# Patient Record
Sex: Female | Born: 1985 | Race: White | Hispanic: No | Marital: Single | Smoking: Current every day smoker
Health system: Southern US, Community
[De-identification: ages and names within clinical notes are randomized; demographics above are authoritative.]

## PROBLEM LIST (undated history)

## (undated) DIAGNOSIS — A6000 Herpesviral infection of urogenital system, unspecified: Secondary | ICD-10-CM

## (undated) HISTORY — PX: ORTHOPEDIC SURGERY: SHX850

## (undated) SURGERY — Surgical Case
Anesthesia: *Unknown

---
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
## 2006-01-28 ENCOUNTER — Observation Stay: Payer: Self-pay

## 2006-01-29 ENCOUNTER — Ambulatory Visit: Payer: Self-pay | Admitting: Obstetrics & Gynecology

## 2006-02-01 ENCOUNTER — Observation Stay: Payer: Self-pay | Admitting: Obstetrics & Gynecology

## 2006-03-06 ENCOUNTER — Observation Stay: Payer: Self-pay

## 2006-03-21 ENCOUNTER — Inpatient Hospital Stay: Payer: Self-pay | Admitting: Obstetrics & Gynecology

## 2006-08-18 ENCOUNTER — Emergency Department: Payer: Self-pay | Admitting: Emergency Medicine

## 2007-02-27 ENCOUNTER — Inpatient Hospital Stay: Payer: Self-pay | Admitting: Obstetrics & Gynecology

## 2010-05-28 ENCOUNTER — Ambulatory Visit: Payer: Self-pay | Admitting: Family Medicine

## 2010-05-29 ENCOUNTER — Inpatient Hospital Stay: Payer: Self-pay | Admitting: Surgery

## 2012-05-05 ENCOUNTER — Ambulatory Visit: Payer: Self-pay | Admitting: Emergency Medicine

## 2012-05-08 DIAGNOSIS — L732 Hidradenitis suppurativa: Secondary | ICD-10-CM | POA: Insufficient documentation

## 2013-06-04 ENCOUNTER — Emergency Department: Payer: Self-pay | Admitting: Internal Medicine

## 2013-06-04 LAB — RAPID INFLUENZA A&B ANTIGENS (ARMC ONLY)

## 2013-06-19 ENCOUNTER — Emergency Department: Payer: Self-pay | Admitting: Emergency Medicine

## 2013-06-19 LAB — ETHANOL
ETHANOL %: 0.066 % (ref 0.000–0.080)
ETHANOL LVL: 66 mg/dL

## 2013-06-19 LAB — CBC WITH DIFFERENTIAL/PLATELET
BASOS PCT: 0.6 %
Basophil #: 0.1 10*3/uL (ref 0.0–0.1)
Eosinophil #: 0 10*3/uL (ref 0.0–0.7)
Eosinophil %: 0.3 %
HCT: 40.9 % (ref 35.0–47.0)
HGB: 14.3 g/dL (ref 12.0–16.0)
LYMPHS ABS: 1.6 10*3/uL (ref 1.0–3.6)
LYMPHS PCT: 14.5 %
MCH: 32.6 pg (ref 26.0–34.0)
MCHC: 34.9 g/dL (ref 32.0–36.0)
MCV: 93 fL (ref 80–100)
Monocyte #: 0.4 x10 3/mm (ref 0.2–0.9)
Monocyte %: 4.1 %
NEUTROS PCT: 80.5 %
Neutrophil #: 8.8 10*3/uL — ABNORMAL HIGH (ref 1.4–6.5)
Platelet: 170 10*3/uL (ref 150–440)
RBC: 4.38 10*6/uL (ref 3.80–5.20)
RDW: 13.3 % (ref 11.5–14.5)
WBC: 11 10*3/uL (ref 3.6–11.0)

## 2013-06-19 LAB — PROTIME-INR
INR: 1
Prothrombin Time: 12.9 secs (ref 11.5–14.7)

## 2013-06-19 LAB — COMPREHENSIVE METABOLIC PANEL
ALK PHOS: 61 U/L
ANION GAP: 6 — AB (ref 7–16)
AST: 23 U/L (ref 15–37)
Albumin: 3.6 g/dL (ref 3.4–5.0)
BUN: 8 mg/dL (ref 7–18)
Bilirubin,Total: 0.2 mg/dL (ref 0.2–1.0)
CALCIUM: 8.7 mg/dL (ref 8.5–10.1)
CO2: 23 mmol/L (ref 21–32)
CREATININE: 0.77 mg/dL (ref 0.60–1.30)
Chloride: 109 mmol/L — ABNORMAL HIGH (ref 98–107)
EGFR (African American): >60
Glucose: 145 mg/dL — ABNORMAL HIGH (ref 65–99)
Osmolality: 277 (ref 275–301)
Potassium: 3.6 mmol/L (ref 3.5–5.1)
SGPT (ALT): 25 U/L (ref 12–78)
Sodium: 138 mmol/L (ref 136–145)
Total Protein: 7.7 g/dL (ref 6.4–8.2)

## 2013-06-23 ENCOUNTER — Ambulatory Visit: Payer: Self-pay | Admitting: General Practice

## 2013-11-19 ENCOUNTER — Emergency Department: Payer: Self-pay | Admitting: Emergency Medicine

## 2013-11-19 LAB — CBC WITH DIFFERENTIAL/PLATELET
Basophil #: 0.1 10*3/uL (ref 0.0–0.1)
Basophil %: 1.3 %
EOS ABS: 0.1 10*3/uL (ref 0.0–0.7)
Eosinophil %: 1.2 %
HCT: 40.5 % (ref 35.0–47.0)
HGB: 13.7 g/dL (ref 12.0–16.0)
Lymphocyte #: 2.6 10*3/uL (ref 1.0–3.6)
Lymphocyte %: 25.6 %
MCH: 31.7 pg (ref 26.0–34.0)
MCHC: 33.7 g/dL (ref 32.0–36.0)
MCV: 94 fL (ref 80–100)
Monocyte #: 0.5 x10 3/mm (ref 0.2–0.9)
Monocyte %: 5.3 %
Neutrophil #: 6.7 10*3/uL — ABNORMAL HIGH (ref 1.4–6.5)
Neutrophil %: 66.6 %
Platelet: 166 10*3/uL (ref 150–440)
RBC: 4.3 10*6/uL (ref 3.80–5.20)
RDW: 12.9 % (ref 11.5–14.5)
WBC: 10.1 10*3/uL (ref 3.6–11.0)

## 2013-11-19 LAB — URINALYSIS, COMPLETE
BACTERIA: NONE SEEN
BILIRUBIN, UR: NEGATIVE
Blood: NEGATIVE
Glucose,UR: NEGATIVE mg/dL (ref 0–75)
KETONE: NEGATIVE
LEUKOCYTE ESTERASE: NEGATIVE
Nitrite: NEGATIVE
Ph: 6 (ref 4.5–8.0)
Protein: NEGATIVE
RBC,UR: <1 /HPF (ref 0–5)
Specific Gravity: 1.011 (ref 1.003–1.030)
Squamous Epithelial: 1

## 2013-11-19 LAB — COMPREHENSIVE METABOLIC PANEL
ALBUMIN: 3.3 g/dL — AB (ref 3.4–5.0)
AST: 20 U/L (ref 15–37)
Alkaline Phosphatase: 65 U/L
Anion Gap: 7 (ref 7–16)
BUN: 6 mg/dL — ABNORMAL LOW (ref 7–18)
Bilirubin,Total: 0.2 mg/dL (ref 0.2–1.0)
CHLORIDE: 107 mmol/L (ref 98–107)
Calcium, Total: 8.8 mg/dL (ref 8.5–10.1)
Co2: 23 mmol/L (ref 21–32)
Creatinine: 1.04 mg/dL (ref 0.60–1.30)
EGFR (African American): >60
Glucose: 114 mg/dL — ABNORMAL HIGH (ref 65–99)
Osmolality: 272 (ref 275–301)
POTASSIUM: 3.4 mmol/L — AB (ref 3.5–5.1)
SGPT (ALT): 24 U/L (ref 12–78)
Sodium: 137 mmol/L (ref 136–145)
Total Protein: 7.3 g/dL (ref 6.4–8.2)

## 2013-11-19 LAB — LIPASE, BLOOD: Lipase: 138 U/L (ref 73–393)

## 2013-11-19 LAB — HCG, QUANTITATIVE, PREGNANCY: Beta Hcg, Quant.: 4374 m[IU]/mL — ABNORMAL HIGH

## 2013-12-31 ENCOUNTER — Ambulatory Visit: Payer: Self-pay | Admitting: Family Medicine

## 2014-01-05 ENCOUNTER — Emergency Department: Payer: Self-pay | Admitting: Emergency Medicine

## 2014-01-05 LAB — CBC
HCT: 36.7 % (ref 35.0–47.0)
HGB: 12.3 g/dL (ref 12.0–16.0)
MCH: 31 pg (ref 26.0–34.0)
MCHC: 33.4 g/dL (ref 32.0–36.0)
MCV: 93 fL (ref 80–100)
PLATELETS: 190 10*3/uL (ref 150–440)
RBC: 3.95 10*6/uL (ref 3.80–5.20)
RDW: 12.7 % (ref 11.5–14.5)
WBC: 12.8 10*3/uL — ABNORMAL HIGH (ref 3.6–11.0)

## 2014-01-05 LAB — URINALYSIS, COMPLETE
BACTERIA: NONE SEEN
BILIRUBIN, UR: NEGATIVE
GLUCOSE, UR: NEGATIVE mg/dL (ref 0–75)
Ketone: NEGATIVE
Leukocyte Esterase: NEGATIVE
NITRITE: NEGATIVE
PROTEIN: NEGATIVE
Ph: 5 (ref 4.5–8.0)
RBC,UR: 3 /HPF (ref 0–5)
SPECIFIC GRAVITY: 1.019 (ref 1.003–1.030)
WBC UR: 3 /HPF (ref 0–5)

## 2014-01-05 LAB — COMPREHENSIVE METABOLIC PANEL
ALT: 14 U/L
Albumin: 3 g/dL — ABNORMAL LOW (ref 3.4–5.0)
Alkaline Phosphatase: 62 U/L
Anion Gap: 9 (ref 7–16)
BILIRUBIN TOTAL: 0.3 mg/dL (ref 0.2–1.0)
BUN: 7 mg/dL (ref 7–18)
CO2: 21 mmol/L (ref 21–32)
Calcium, Total: 9.3 mg/dL (ref 8.5–10.1)
Chloride: 109 mmol/L — ABNORMAL HIGH (ref 98–107)
Creatinine: 0.71 mg/dL (ref 0.60–1.30)
EGFR (African American): >60
GLUCOSE: 75 mg/dL (ref 65–99)
Osmolality: 274 (ref 275–301)
Potassium: 3.8 mmol/L (ref 3.5–5.1)
SGOT(AST): 18 U/L (ref 15–37)
SODIUM: 139 mmol/L (ref 136–145)
Total Protein: 7.4 g/dL (ref 6.4–8.2)

## 2014-01-05 LAB — LIPASE, BLOOD: Lipase: 81 U/L (ref 73–393)

## 2014-01-05 LAB — HCG, QUANTITATIVE, PREGNANCY: BETA HCG, QUANT.: 43965 m[IU]/mL — AB

## 2014-04-22 ENCOUNTER — Encounter: Payer: Self-pay | Admitting: Obstetrics and Gynecology

## 2014-05-27 ENCOUNTER — Encounter: Payer: Self-pay | Admitting: Obstetrics & Gynecology

## 2014-06-15 ENCOUNTER — Observation Stay: Payer: Self-pay | Admitting: Obstetrics and Gynecology

## 2014-07-01 ENCOUNTER — Encounter: Payer: Self-pay | Admitting: Maternal & Fetal Medicine

## 2014-07-03 ENCOUNTER — Observation Stay: Payer: Self-pay | Admitting: Obstetrics and Gynecology

## 2014-07-05 ENCOUNTER — Inpatient Hospital Stay: Payer: Self-pay

## 2014-08-22 IMAGING — CR DG HUMERUS 2V *L*
1 series · 3 of 3 positions shown · non-contrast
Comparison: DG ELBOW COMPLETE 3+V*L* dated 06/19/2013

CLINICAL DATA: Distal humerus fracture

EXAM:
LEFT HUMERUS - 2+ VIEW

[Series 1: ap · 0.17mm/px · 3 of 3 slices shown]
[im 1/3]
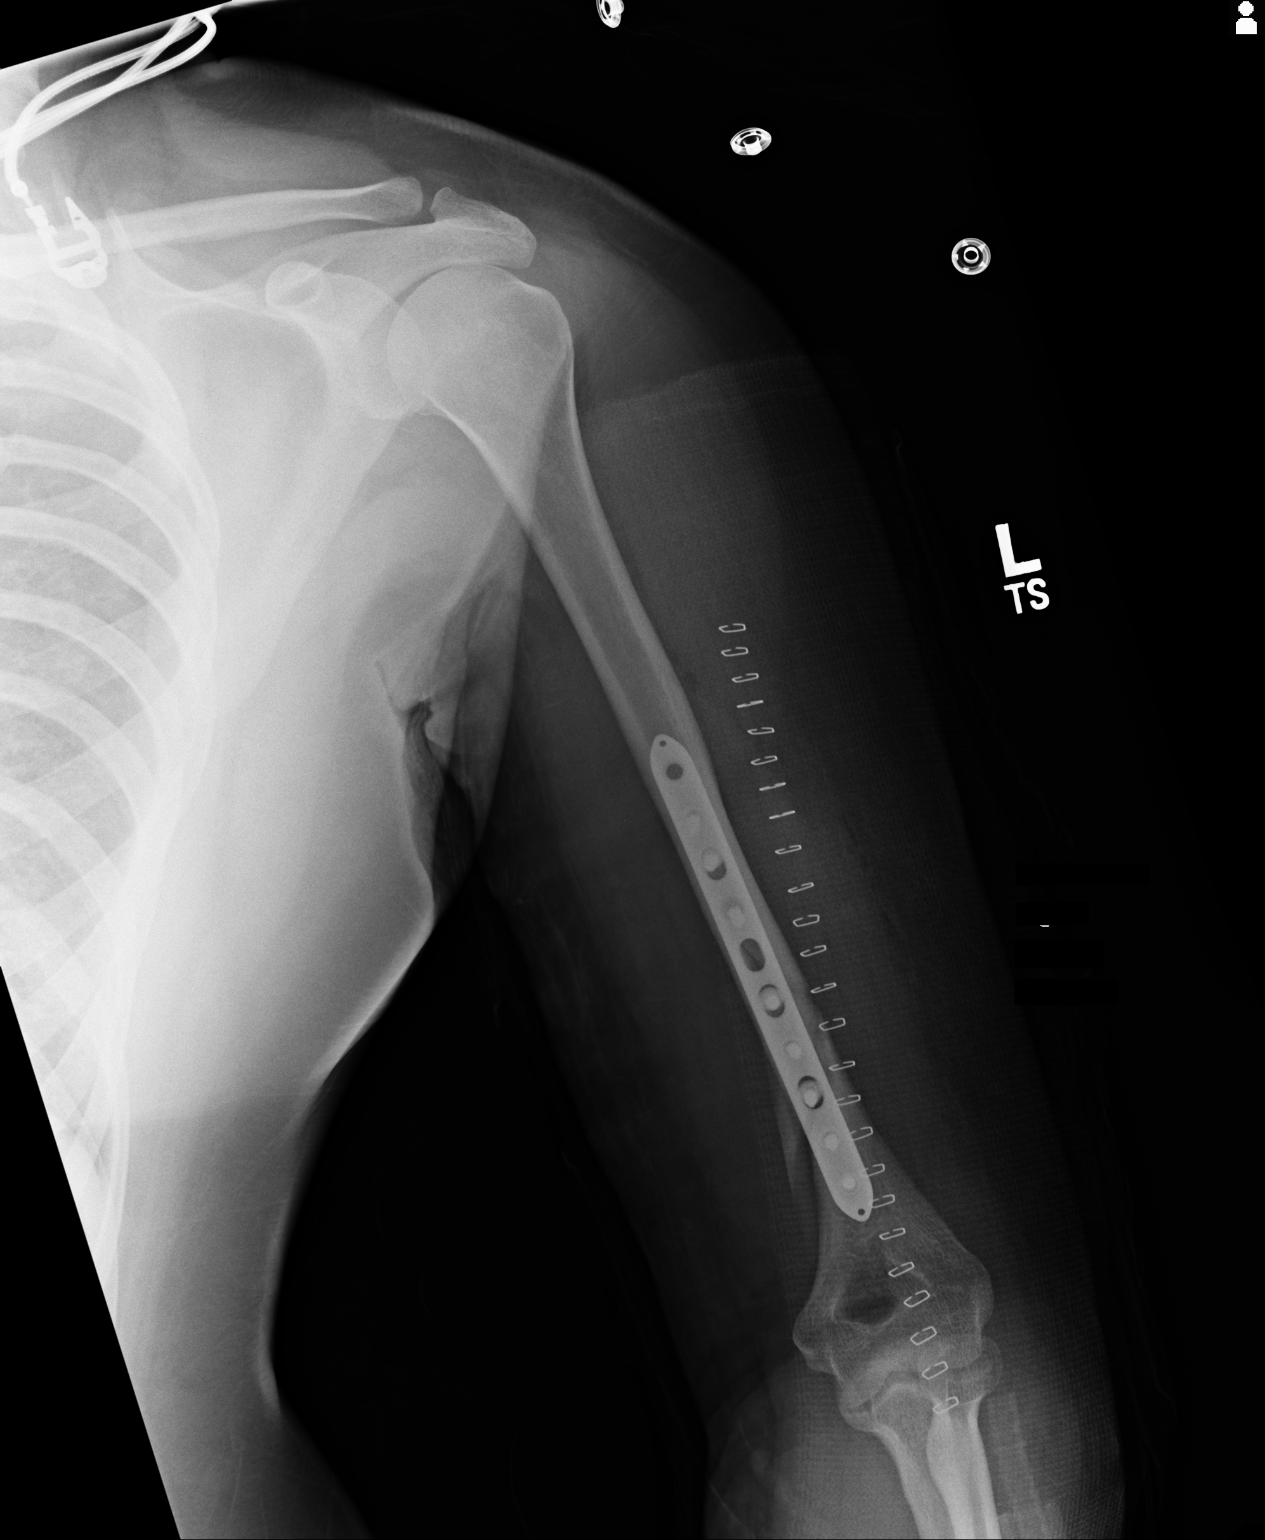
[im 2/3]
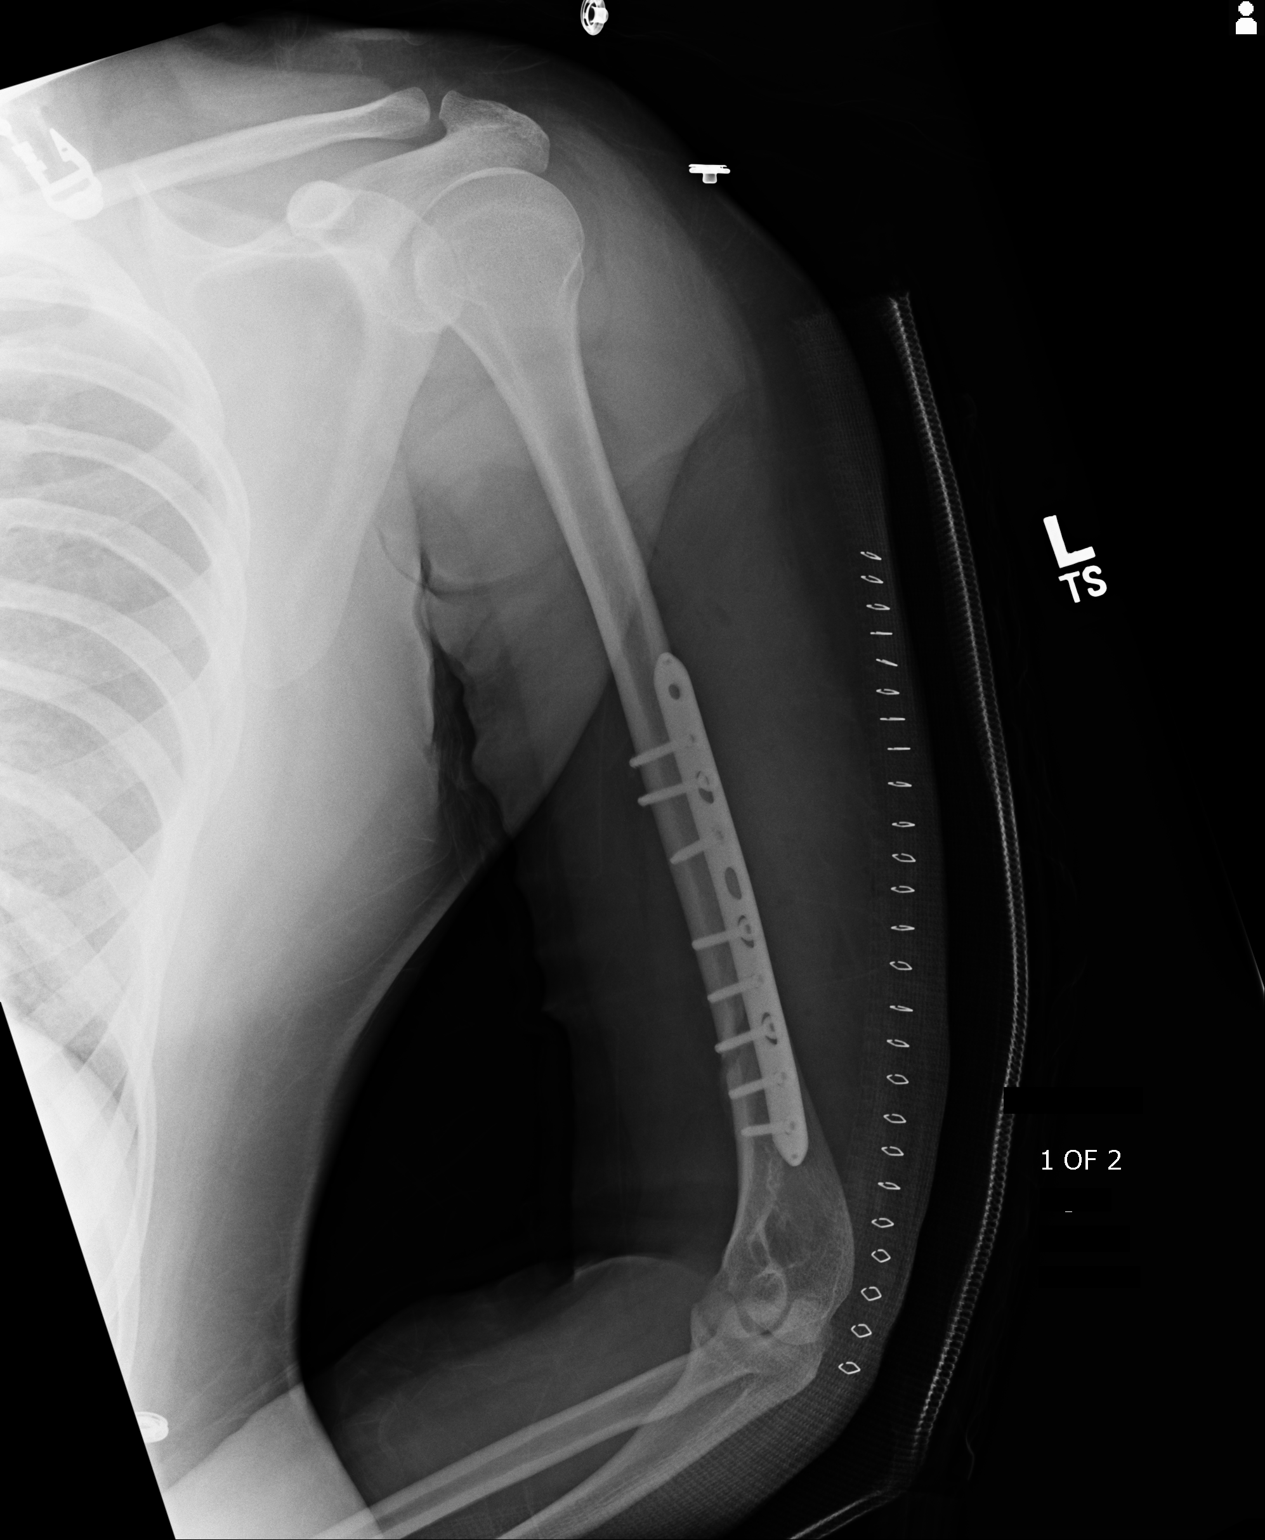
[im 3/3]
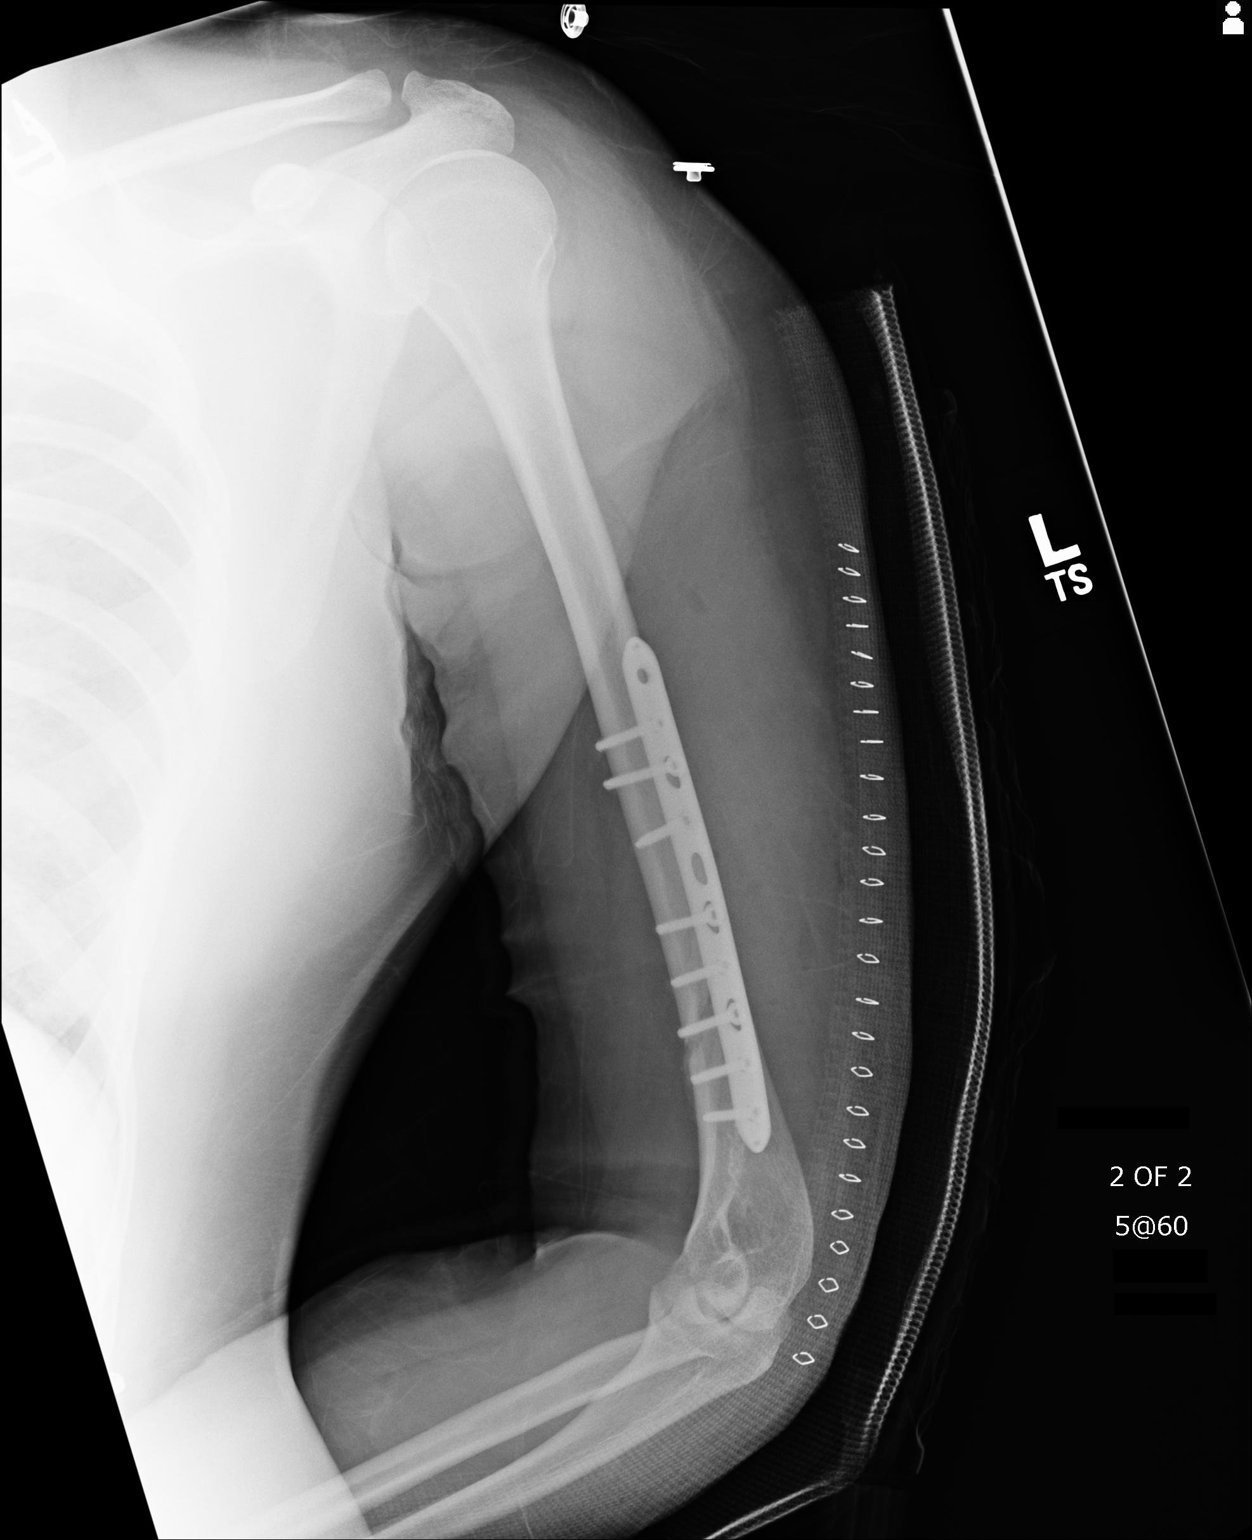

[3 of 3 positions shown; findings below may reference images not displayed]

FINDINGS: There is internal plate fixation of the distal humerus fracture with
dynamic fixation plate and 8 cortical screws. Marked improvement in
alignment. No complicating features.
IMPRESSION: No complication following open reduction internal fixation of distal
left humerus fracture.

## 2014-08-28 NOTE — Op Note (Signed)
PATIENT NAME:  Jasmine Hale, Jasmine Hale MR#:  [id number] DATE OF BIRTH:  1986-01-11  DATE OF PROCEDURE:  06/22/2013  PREOPERATIVE DIAGNOSIS: Comminuted left distal humeral shaft fracture.   POSTOPERATIVE DIAGNOSIS: Comminuted left distal humeral shaft fracture.   PROCEDURE PERFORMED: Open reduction internal fixation of left distal humeral shaft fracture.   SURGEON: Francesco SorJames Hooten, MD.   ANESTHESIA: General.   ESTIMATED BLOOD LOSS: 75 mL.   FLUIDS REPLACED: 1500 mL of crystalloid.   TOURNIQUET TIME: 9 minutes.   DRAINS: None.   IMPLANTS UTILIZED: DePuy/Biomet 10-hole 3.5 mm locking plate, seven 3.5 mm cortical screws and one 3.5 mm locking screw.   INDICATIONS FOR SURGERY: The patient is a 29 year old right-hand dominant female, who fell last week and landed on her left arm. X-rays demonstrated a comminuted fracture of the distal shaft of the humerus. After discussion of the risks and benefits of surgical intervention, the patient expressed understanding of the risks and benefits, and agreed with plans for surgical intervention.   PROCEDURE IN DETAIL: The patient was brought into the operating room then, after adequate general anesthesia was achieved, the patient was positioned in a right lateral decubitus position on beanbag and axillary roll was placed. The patient's left arm and forearm were cleaned and prepped with alcohol and DuraPrep and draped in the usual sterile fashion. A "timeout" was performed as per usual protocol. A sterile tourniquet was initially applied and the left upper extremity was exsanguinated using an Esmarch and the tourniquet was inflated to 250 mmHg. A posterior longitudinal incision was made. The site where the lateral and long heads of the triceps was defined and a triceps splitting approach was utilized. The radial nerve was identified and carefully monitored throughout the procedure. The medial head of the triceps was also split in line, again taking care to protect the  radial nerve. The fracture site was thus identified with fragmentation noted. Provisional reduction was performed. Due to the relatively small size of the patient's humerus as well as the fragmentation, it was elected not to use a 4.5 mm plate, but to attempt to stabilize with a 3.5 mm locking plate. A 10-hole 3.5 mm locking plate was identified as being appropriate size to span the fracture. The plate was extended under the radial nerve directly on the posterior aspect of the humerus. Again, care was taken to gently retract the radial nerve while placement of the plate.   Position was evaluated in multiple planes using the FluoroScan. Given the long oblique fragments, two 3.5 mm cortical screws were used in a lag type fashion through the plate. Additional fixation of the plate was performed with an additional five 3.5 mm cortical screws and one 3.5 mm locking screw. Again, reduction was confirmed in multiple planes using FluoroScan with good position of the hardware and good reduction noted. The radial nerve was again assessed and it was safely noted to pass over the proximal most portion of the plate with that hole left open as a potential marker. The wound was irrigated with copious amounts of normal saline with antibiotic solution. It should be noted, that tourniquet had been deflated after identification of the radial nerve after initial tourniquet time of only 9 minutes. Good hemostasis was noted. The fascia was closed using interrupted sutures of #1 Vicryl. The subcutaneous tissue was approximated in layers using first #0 Vicryl followed by 2-0 Vicryl. The skin was closed with skin staples. Sterile dressing was applied followed by application of a posterior splint.   The  patient tolerated the procedure well. She was transported to the recovery room in stable condition. Initial assessment of radial nerve function was noted to be intact in the recovery room.  ____________________________ Illene Labrador. Angie Fava., MD jph:aw D: 06/23/2013 07:32:00 ET T: 06/23/2013 07:49:31 ET JOB#: [id number]  cc: Fayrene Fearing P. Angie Fava., MD, <Dictator> Illene Labrador Angie Fava MD ELECTRONICALLY SIGNED 06/26/2013 6:43

## 2014-09-14 NOTE — H&P (Signed)
L&D Evaluation:  History Expanded:  HPI Pt is a 29 yo [id number] at 6748w2d with an EDC of 07/17/14 per early US, presents to L&D with reports of SROM around 11 am today. She reports +FM, denies vb or regular contractions. 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 She was scheduled for IOL at 39 wks. She is O+, VI, RI, GBS negative. TDAP UTD.   Maternal Varicella Immune   Rubella Results (Maternal) immune   Presents with contractions   Patient's Medical History HSV, migraines   Patient's Surgical History other  arm surgery   Medications Pre Natal Vitamins  Valtrex   Allergies Sulfa   Social History tobacco  1/2 - 3/4 PPD   Family History Non-Contributory   ROS:  ROS All systems were reviewed.  HEENT, CNS, GI, GU, Respiratory, CV, Renal and Musculoskeletal systems were found to be normal.   Exam:  Vital Signs stable   General no apparent distress   Mental Status clear   Chest clear   Heart normal sinus rhythm   Abdomen gravid, non-tender   Estimated Fetal Weight Small for gestational age, 825-12 per US on 2/25   Pelvic 4 cm per RN   Mebranes Ruptured   Description clear   FHT normal rate with no decels, 140, moderate variability, +accels, no decels   Ucx irregular   Skin dry, no lesions, no rashes   Lymph no lymphadenopathy   Impression:  Impression reactive NST, IUP at 3548w2d, SROM   Plan:  Comments Pt ambulated for approx 1 hour with no increase in contractions. Discussed Pitocin augmentation now vs ambulating for another hour or two. Pt opts to proceed with Pitocin now. Anticipate vaginal delivery.  Epidural when appropriate.   Electronic Signatures: Jasmine Hale, Jasmine Hale (CNM)  (Signed 29-Feb-16 14:00)  Authored: L&D Evaluation   Last Updated: 29-Feb-16 14:00 by Jasmine Hale, Kambrea Carrasco Hale  (CNM)

## 2014-09-14 NOTE — H&P (Signed)
L&D Evaluation:  History:  HPI Pt is a 29 yo [id number] at 35.[redacted] weeks GA with an EDC of 07/17/14 presents to L&D with reports of contractions that have gotten worse since this late last night. She reports +FM, denies vb, or lof. Her prenatal course is significant for HSV, prior history of PT at 33 weeks, she is currently on 17-P, and SGA with growth scan showing EFW 11% with repeated growths scans ordered. She is O+, VI, RI.   Presents with contractions   Patient's Medical History HSV, migraines   Patient's Surgical History other  arm surgery   Medications Pre Natal Vitamins   Allergies Sulfa   Social History tobacco   Family History Non-Contributory   ROS:  ROS All systems were reviewed.  HEENT, CNS, GI, GU, Respiratory, CV, Renal and Musculoskeletal systems were found to be normal.   Exam:  Vital Signs stable   General no apparent distress   Mental Status clear   Chest clear   Heart normal sinus rhythm   Abdomen gravid, tender with contractions   Pelvic 3/75-80/-1   Mebranes Intact   FHT normal rate with no decels, 135, moderate variability, +accels, no decels   Ucx regular, Q2   Skin dry, no lesions, no rashes   Lymph no lymphadenopathy   Impression:  Impression reactive NST, IUP at 35.3, R/O labor   Plan:  Plan UA, EFM/NST, monitor contractions and for cervical change   Follow Up Appointment already scheduled   Electronic Signatures: Jannet MantisSubudhi, Kenyatta Gloeckner (CNM)  (Signed 09-Feb-16 11:36)  Authored: L&D Evaluation   Last Updated: 09-Feb-16 11:36 by Jannet MantisSubudhi, Siraj Dermody (CNM)

## 2014-12-09 DIAGNOSIS — S42111A Displaced fracture of body of scapula, right shoulder, initial encounter for closed fracture: Secondary | ICD-10-CM | POA: Insufficient documentation

## 2014-12-09 DIAGNOSIS — S62336A Displaced fracture of neck of fifth metacarpal bone, right hand, initial encounter for closed fracture: Secondary | ICD-10-CM | POA: Insufficient documentation

## 2014-12-10 DIAGNOSIS — S2231XA Fracture of one rib, right side, initial encounter for closed fracture: Secondary | ICD-10-CM | POA: Insufficient documentation

## 2014-12-10 DIAGNOSIS — S36039A Unspecified laceration of spleen, initial encounter: Secondary | ICD-10-CM | POA: Insufficient documentation

## 2014-12-10 DIAGNOSIS — J982 Interstitial emphysema: Secondary | ICD-10-CM | POA: Insufficient documentation

## 2014-12-10 DIAGNOSIS — S32810A Multiple fractures of pelvis with stable disruption of pelvic ring, initial encounter for closed fracture: Secondary | ICD-10-CM | POA: Insufficient documentation

## 2014-12-10 DIAGNOSIS — J939 Pneumothorax, unspecified: Secondary | ICD-10-CM | POA: Insufficient documentation

## 2014-12-10 DIAGNOSIS — S42302A Unspecified fracture of shaft of humerus, left arm, initial encounter for closed fracture: Secondary | ICD-10-CM | POA: Insufficient documentation

## 2014-12-10 DIAGNOSIS — S27321A Contusion of lung, unilateral, initial encounter: Secondary | ICD-10-CM | POA: Insufficient documentation

## 2014-12-21 DIAGNOSIS — S2232XA Fracture of one rib, left side, initial encounter for closed fracture: Secondary | ICD-10-CM | POA: Insufficient documentation

## 2014-12-21 DIAGNOSIS — S42144A Nondisplaced fracture of glenoid cavity of scapula, right shoulder, initial encounter for closed fracture: Secondary | ICD-10-CM | POA: Insufficient documentation

## 2014-12-21 DIAGNOSIS — S62309A Unspecified fracture of unspecified metacarpal bone, initial encounter for closed fracture: Secondary | ICD-10-CM | POA: Insufficient documentation

## 2014-12-21 DIAGNOSIS — S42209A Unspecified fracture of upper end of unspecified humerus, initial encounter for closed fracture: Secondary | ICD-10-CM | POA: Insufficient documentation

## 2015-04-26 ENCOUNTER — Ambulatory Visit: Payer: Medicaid Other | Admitting: Physical Therapy

## 2015-05-08 NOTE — L&D Delivery Note (Signed)
Delivery Note At 5:24 AM a viable female was delivered via Vaginal, Spontaneous Delivery (Presentation: Right Occiput Anterior).  APGAR: 8, 9; weight 6 lb 8.1 oz (2950 g).   Placenta status: Intact, Spontaneous.  Cord: 3 vessels with the following complications: True Knot.  Cord pH: n/a  Anesthesia: Spinal  Episiotomy: None Lacerations: 1st degree Suture Repair: not repaired Est. Blood Loss (mL):  200cc  Mom to postpartum.  Baby to Couplet care / Skin to Skin.  Mom presented with PROM and was augmented with pitocin.  Epidural not able to be placed successfully, spinal placed.  Patient progressed to complete and pushed with 2 contractions.  Viable female delivered without difficulty, nuchal x1 reduced at perineum, baby placed on mom's chest.  After brief delay, cord was doubly clamped and cut by FOB.  True knot x1, loose.  Placenta delivered spontaneously and intact.  Shallow 1st degree, hemostatic.  We sang Happy [place] OuchBirthday to baby Samuel GermanyGage.  Mom has requested PPTL and is scheduled for this afternoon.  Zoie Sarin C Vasily Fedewa 10/14/2015, 8:43 AM

## 2015-05-11 ENCOUNTER — Ambulatory Visit: Payer: Medicaid Other | Attending: Obstetrics & Gynecology | Admitting: Physical Therapy

## 2015-10-13 ENCOUNTER — Inpatient Hospital Stay
Admission: EM | Admit: 2015-10-13 | Discharge: 2015-10-16 | DRG: 767 | Disposition: A | Discharge status: Home / Self Care | Payer: Medicaid Other | Attending: Obstetrics & Gynecology | Admitting: Obstetrics & Gynecology

## 2015-10-13 DIAGNOSIS — G8929 Other chronic pain: Secondary | ICD-10-CM | POA: Diagnosis present

## 2015-10-13 DIAGNOSIS — A6000 Herpesviral infection of urogenital system, unspecified: Secondary | ICD-10-CM | POA: Diagnosis present

## 2015-10-13 DIAGNOSIS — O98513 Other viral diseases complicating pregnancy, third trimester: Secondary | ICD-10-CM | POA: Diagnosis present

## 2015-10-13 DIAGNOSIS — F17200 Nicotine dependence, unspecified, uncomplicated: Secondary | ICD-10-CM | POA: Diagnosis present

## 2015-10-13 DIAGNOSIS — O0993 Supervision of high risk pregnancy, unspecified, third trimester: Secondary | ICD-10-CM

## 2015-10-13 DIAGNOSIS — O24415 Gestational diabetes mellitus in pregnancy, controlled by oral hypoglycemic drugs: Secondary | ICD-10-CM | POA: Diagnosis present

## 2015-10-13 DIAGNOSIS — Z833 Family history of diabetes mellitus: Secondary | ICD-10-CM

## 2015-10-13 DIAGNOSIS — O99334 Smoking (tobacco) complicating childbirth: Secondary | ICD-10-CM | POA: Diagnosis present

## 2015-10-13 DIAGNOSIS — Z882 Allergy status to sulfonamides status: Secondary | ICD-10-CM

## 2015-10-13 DIAGNOSIS — Z302 Encounter for sterilization: Secondary | ICD-10-CM

## 2015-10-13 DIAGNOSIS — F17210 Nicotine dependence, cigarettes, uncomplicated: Secondary | ICD-10-CM | POA: Diagnosis present

## 2015-10-13 DIAGNOSIS — O99210 Obesity complicating pregnancy, unspecified trimester: Secondary | ICD-10-CM | POA: Diagnosis present

## 2015-10-13 DIAGNOSIS — G8921 Chronic pain due to trauma: Secondary | ICD-10-CM | POA: Diagnosis present

## 2015-10-13 DIAGNOSIS — O2340 Unspecified infection of urinary tract in pregnancy, unspecified trimester: Secondary | ICD-10-CM | POA: Diagnosis not present

## 2015-10-13 DIAGNOSIS — Z3A38 38 weeks gestation of pregnancy: Secondary | ICD-10-CM

## 2015-10-13 HISTORY — DX: Herpesviral infection of urogenital system, unspecified: A60.00

## 2015-10-13 NOTE — OB Triage Note (Signed)
Pt arrived to OBS 3 via ED with c/o of possible SROM at 2230 large amount clear/cloudy. Pt stating having very few irregular contractions. Pt placed on monitors and oriented to room.

## 2015-10-14 ENCOUNTER — Encounter: Payer: Self-pay | Admitting: *Deleted

## 2015-10-14 ENCOUNTER — Inpatient Hospital Stay: Payer: Medicaid Other | Admitting: Anesthesiology

## 2015-10-14 DIAGNOSIS — O99210 Obesity complicating pregnancy, unspecified trimester: Secondary | ICD-10-CM | POA: Diagnosis present

## 2015-10-14 DIAGNOSIS — F17210 Nicotine dependence, cigarettes, uncomplicated: Secondary | ICD-10-CM | POA: Diagnosis present

## 2015-10-14 DIAGNOSIS — O24425 Gestational diabetes mellitus in childbirth, controlled by oral hypoglycemic drugs: Secondary | ICD-10-CM | POA: Diagnosis present

## 2015-10-14 DIAGNOSIS — Z3A38 38 weeks gestation of pregnancy: Secondary | ICD-10-CM | POA: Diagnosis not present

## 2015-10-14 DIAGNOSIS — F17200 Nicotine dependence, unspecified, uncomplicated: Secondary | ICD-10-CM | POA: Diagnosis present

## 2015-10-14 DIAGNOSIS — O99334 Smoking (tobacco) complicating childbirth: Secondary | ICD-10-CM | POA: Diagnosis present

## 2015-10-14 DIAGNOSIS — O2340 Unspecified infection of urinary tract in pregnancy, unspecified trimester: Secondary | ICD-10-CM | POA: Diagnosis not present

## 2015-10-14 DIAGNOSIS — G8921 Chronic pain due to trauma: Secondary | ICD-10-CM | POA: Diagnosis present

## 2015-10-14 DIAGNOSIS — G8929 Other chronic pain: Secondary | ICD-10-CM | POA: Diagnosis present

## 2015-10-14 DIAGNOSIS — O98513 Other viral diseases complicating pregnancy, third trimester: Secondary | ICD-10-CM | POA: Diagnosis present

## 2015-10-14 DIAGNOSIS — Z302 Encounter for sterilization: Secondary | ICD-10-CM | POA: Diagnosis not present

## 2015-10-14 DIAGNOSIS — O24415 Gestational diabetes mellitus in pregnancy, controlled by oral hypoglycemic drugs: Secondary | ICD-10-CM | POA: Diagnosis present

## 2015-10-14 DIAGNOSIS — Z833 Family history of diabetes mellitus: Secondary | ICD-10-CM | POA: Diagnosis not present

## 2015-10-14 DIAGNOSIS — Z882 Allergy status to sulfonamides status: Secondary | ICD-10-CM | POA: Diagnosis not present

## 2015-10-14 DIAGNOSIS — O0993 Supervision of high risk pregnancy, unspecified, third trimester: Secondary | ICD-10-CM

## 2015-10-14 DIAGNOSIS — A6000 Herpesviral infection of urogenital system, unspecified: Secondary | ICD-10-CM | POA: Diagnosis present

## 2015-10-14 LAB — URINE DRUG SCREEN, QUALITATIVE (ARMC ONLY)
AMPHETAMINES, UR SCREEN: NOT DETECTED
BARBITURATES, UR SCREEN: NOT DETECTED
BENZODIAZEPINE, UR SCRN: NOT DETECTED
COCAINE METABOLITE, UR ~~LOC~~: NOT DETECTED
Cannabinoid 50 Ng, Ur ~~LOC~~: NOT DETECTED
MDMA (Ecstasy)Ur Screen: NOT DETECTED
METHADONE SCREEN, URINE: NOT DETECTED
Opiate, Ur Screen: NOT DETECTED
Phencyclidine (PCP) Ur S: NOT DETECTED
TRICYCLIC, UR SCREEN: NOT DETECTED

## 2015-10-14 LAB — CBC
HCT: 33 % — ABNORMAL LOW (ref 35.0–47.0)
Hemoglobin: 11.2 g/dL — ABNORMAL LOW (ref 12.0–16.0)
MCH: 30.2 pg (ref 26.0–34.0)
MCHC: 34 g/dL (ref 32.0–36.0)
MCV: 88.9 fL (ref 80.0–100.0)
PLATELETS: 154 10*3/uL (ref 150–440)
RBC: 3.71 MIL/uL — ABNORMAL LOW (ref 3.80–5.20)
RDW: 15 % — AB (ref 11.5–14.5)
WBC: 14.1 10*3/uL — AB (ref 3.6–11.0)

## 2015-10-14 LAB — GLUCOSE, CAPILLARY: Glucose-Capillary: 68 mg/dL (ref 65–99)

## 2015-10-14 LAB — TYPE AND SCREEN
ABO/RH(D): O POS
Antibody Screen: NEGATIVE

## 2015-10-14 MED ORDER — SODIUM CHLORIDE 0.9% FLUSH: 3.0000 mL | INTRAVENOUS | Status: DC | PRN

## 2015-10-14 MED ORDER — LIDOCAINE HCL (PF) 1 % IJ SOLN: 30.0000 mL | INTRAMUSCULAR | Status: DC | PRN

## 2015-10-14 MED ORDER — NALBUPHINE HCL 10 MG/ML IJ SOLN: 5.0000 mg | INTRAMUSCULAR | Status: DC | PRN

## 2015-10-14 MED ORDER — NALOXONE HCL 0.4 MG/ML IJ SOLN: 0.4000 mg | INTRAMUSCULAR | Status: DC | PRN

## 2015-10-14 MED ORDER — MEPERIDINE HCL 25 MG/ML IJ SOLN: 6.2500 mg | INTRAMUSCULAR | Status: DC | PRN

## 2015-10-14 MED ORDER — SOD CITRATE-CITRIC ACID 500-334 MG/5ML PO SOLN: 30.0000 mL | ORAL | Status: DC | PRN

## 2015-10-14 MED ORDER — MORPHINE SULFATE (PF) 0.5 MG/ML IJ SOLN
INTRAMUSCULAR | Status: AC | PRN
  Administered 2015-10-14: 300 ug via INTRATHECAL

## 2015-10-14 MED ORDER — TERBUTALINE SULFATE 1 MG/ML IJ SOLN: 0.2500 mg | Freq: Once | INTRAMUSCULAR | Status: DC | PRN

## 2015-10-14 MED ORDER — NALBUPHINE HCL 10 MG/ML IJ SOLN: 5.0000 mg | Freq: Once | INTRAMUSCULAR | Status: DC | PRN

## 2015-10-14 MED ORDER — OXYTOCIN 40 UNITS IN LACTATED RINGERS INFUSION - SIMPLE MED
1.0000 m[IU]/min | INTRAVENOUS | Status: DC
  Administered 2015-10-14: 2 m[IU]/min via INTRAVENOUS

## 2015-10-14 MED ORDER — DIPHENHYDRAMINE HCL 50 MG/ML IJ SOLN: 12.5000 mg | INTRAMUSCULAR | Status: DC | PRN

## 2015-10-14 MED ORDER — MISOPROSTOL 200 MCG PO TABS
ORAL_TABLET | ORAL | Status: AC
  Filled 2015-10-14: qty 4

## 2015-10-14 MED ORDER — AMMONIA AROMATIC IN INHA
RESPIRATORY_TRACT | Status: AC
  Filled 2015-10-14: qty 10

## 2015-10-14 MED ORDER — DIPHENHYDRAMINE HCL 25 MG PO CAPS
25.0000 mg | ORAL_CAPSULE | ORAL | Status: DC | PRN
Start: 2015-10-14 — End: 2015-10-16

## 2015-10-14 MED ORDER — LACTATED RINGERS IV SOLN
500.0000 mL | INTRAVENOUS | Status: DC | PRN
  Administered 2015-10-14: 500 mL via INTRAVENOUS

## 2015-10-14 MED ORDER — NALOXONE HCL 2 MG/2ML IJ SOSY
1.0000 ug/kg/h | PREFILLED_SYRINGE | INTRAMUSCULAR | Status: DC | PRN
  Filled 2015-10-14: qty 2

## 2015-10-14 MED ORDER — IBUPROFEN 600 MG PO TABS: 600.0000 mg | ORAL_TABLET | Freq: Four times a day (QID) | ORAL | Status: DC | PRN

## 2015-10-14 MED ORDER — LIDOCAINE HCL (PF) 1 % IJ SOLN
INTRAMUSCULAR | Status: AC
  Filled 2015-10-14: qty 30

## 2015-10-14 MED ORDER — FENTANYL CITRATE (PF) 250 MCG/5ML IJ SOLN
INTRAMUSCULAR | Status: AC | PRN
  Administered 2015-10-14: 25 ug via INTRAVENOUS

## 2015-10-14 MED ORDER — IBUPROFEN 600 MG PO TABS
600.0000 mg | ORAL_TABLET | Freq: Four times a day (QID) | ORAL | Status: DC | PRN
  Administered 2015-10-15 – 2015-10-16 (×3): 600 mg via ORAL
  Filled 2015-10-14 (×3): qty 1

## 2015-10-14 MED ORDER — ONDANSETRON HCL 4 MG/2ML IJ SOLN: 4.0000 mg | Freq: Four times a day (QID) | INTRAMUSCULAR | Status: DC | PRN

## 2015-10-14 MED ORDER — LACTATED RINGERS IV SOLN
INTRAVENOUS | Status: DC
  Administered 2015-10-14 – 2015-10-15 (×5): via INTRAVENOUS

## 2015-10-14 MED ORDER — OXYTOCIN 10 UNIT/ML IJ SOLN
INTRAMUSCULAR | Status: AC
  Filled 2015-10-14: qty 2

## 2015-10-14 MED ORDER — OXYTOCIN 40 UNITS IN LACTATED RINGERS INFUSION - SIMPLE MED
2.5000 [IU]/h | INTRAVENOUS | Status: DC
Start: 2015-10-14 — End: 2015-10-15

## 2015-10-14 MED ORDER — OXYTOCIN BOLUS FROM INFUSION
500.0000 mL | INTRAVENOUS | Status: DC
  Administered 2015-10-14: 500 mL via INTRAVENOUS

## 2015-10-14 MED ORDER — BUPIVACAINE HCL (PF) 0.75 % IJ SOLN
INTRAMUSCULAR | Status: AC | PRN
  Administered 2015-10-14: 4 mg via INTRATHECAL

## 2015-10-14 MED ORDER — OXYCODONE-ACETAMINOPHEN 5-325 MG PO TABS
1.0000 | ORAL_TABLET | Freq: Four times a day (QID) | ORAL | Status: DC | PRN
  Administered 2015-10-14 (×2): 2 via ORAL
  Administered 2015-10-14: 1 via ORAL
  Administered 2015-10-15 – 2015-10-16 (×5): 2 via ORAL
  Filled 2015-10-14 (×3): qty 2
  Filled 2015-10-14: qty 1
  Filled 2015-10-14 (×4): qty 2

## 2015-10-14 MED ORDER — ONDANSETRON HCL 4 MG/2ML IJ SOLN: 4.0000 mg | Freq: Three times a day (TID) | INTRAMUSCULAR | Status: DC | PRN

## 2015-10-14 MED ORDER — FENTANYL 2.5 MCG/ML W/ROPIVACAINE 0.2% IN NS 100 ML EPIDURAL INFUSION (ARMC-ANES)
EPIDURAL | Status: AC
  Filled 2015-10-14: qty 100

## 2015-10-14 MED ORDER — ACETAMINOPHEN 500 MG PO TABS: 1000.0000 mg | ORAL_TABLET | ORAL | Status: DC | PRN

## 2015-10-14 MED ORDER — OXYTOCIN 40 UNITS IN LACTATED RINGERS INFUSION - SIMPLE MED
INTRAVENOUS | Status: AC
  Administered 2015-10-14: 2 m[IU]/min via INTRAVENOUS
  Filled 2015-10-14: qty 1000

## 2015-10-14 MED ORDER — BUTORPHANOL TARTRATE 1 MG/ML IJ SOLN
1.0000 mg | INTRAMUSCULAR | Status: DC | PRN
  Filled 2015-10-14: qty 1

## 2015-10-14 MED ORDER — SCOPOLAMINE 1 MG/3DAYS TD PT72: 1.0000 | MEDICATED_PATCH | Freq: Once | TRANSDERMAL | Status: DC
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
## 2015-10-14 NOTE — Anesthesia Procedure Notes (Signed)
Spinal Patient location during procedure: OB Start time: 10/14/2015 3:20 AM End time: 10/14/2015 3:35 AM Reason for block: at surgeon's request Staffing Anesthesiologist: Elijio MilesVAN STAVEREN, Teren Franckowiak F Performed by: anesthesiologist  Preanesthetic Checklist Completed: patient identified, site marked, surgical consent, pre-op evaluation, timeout performed, IV checked, risks and benefits discussed, monitors and equipment checked and at surgeon's request Spinal Block Patient position: sitting Prep: Betadine Approach: midline Location: L3-4 Injection technique: single-shot Needle Needle type: Quincke  Needle gauge: 25 G Needle length: 9 cm Needle insertion depth: 6 cm Assessment Sensory level: T8

## 2015-10-14 NOTE — Progress Notes (Addendum)
Pt requesting PP BTL be moved to tomorrow. Order for NPO changed to midnight and order for Percocet  & Ibuprofen for pain placed.

## 2015-10-14 NOTE — Progress Notes (Signed)
Patient was scheduled for a PPTL today at 1600 but requested that it be moved until tomorrow. Called CNM, Tammy Brothers, to inform her of patient request. OR was called to reschedule surgery for tomorrow morning (6/10). OR scheduled surgery for 0745 on 10/15/15.
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
## 2015-10-14 NOTE — Anesthesia Preprocedure Evaluation (Signed)
Anesthesia Evaluation  Patient identified by MRN, date of birth, ID band Patient awake    Reviewed: Allergy & Precautions, NPO status   Airway Mallampati: II       Dental  (+) Teeth Intact   Pulmonary Current Smoker,    breath sounds clear to auscultation       Cardiovascular Exercise Tolerance: Good  Rhythm:Regular     Neuro/Psych    GI/Hepatic negative GI ROS, Neg liver ROS,   Endo/Other  diabetes, Type 2  Renal/GU negative Renal ROS     Musculoskeletal   Abdominal Normal abdominal exam  (+)   Peds  Hematology negative hematology ROS (+)   Anesthesia Other Findings   Reproductive/Obstetrics                             Anesthesia Physical Anesthesia Plan  ASA: II  Anesthesia Plan: Spinal   Post-op Pain Management:    Induction:   Airway Management Planned: Simple Face Mask  Additional Equipment:   Intra-op Plan:   Post-operative Plan:   Informed Consent: I have reviewed the patients History and Physical, chart, labs and discussed the procedure including the risks, benefits and alternatives for the proposed anesthesia with the patient or authorized representative who has indicated his/her understanding and acceptance.     Plan Discussed with:   Anesthesia Plan Comments:         Anesthesia Quick Evaluation

## 2015-10-15 ENCOUNTER — Inpatient Hospital Stay: Payer: Medicaid Other | Admitting: Anesthesiology

## 2015-10-15 ENCOUNTER — Encounter: Payer: Self-pay | Admitting: Anesthesiology

## 2015-10-15 ENCOUNTER — Encounter
Admission: EM | Disposition: A | Discharge status: Home / Self Care | Payer: Self-pay | Source: Home / Self Care | Attending: Obstetrics & Gynecology

## 2015-10-15 HISTORY — PX: TUBAL LIGATION: SHX77

## 2015-10-15 LAB — RPR: RPR: NONREACTIVE

## 2015-10-15 SURGERY — LIGATION, FALLOPIAN TUBE, POSTPARTUM
Anesthesia: Spinal | Site: Abdomen | Laterality: Bilateral | Wound class: Clean Contaminated

## 2015-10-15 MED ORDER — FENTANYL CITRATE (PF) 100 MCG/2ML IJ SOLN
INTRAMUSCULAR | Status: DC | PRN
  Administered 2015-10-15 (×2): 50 ug via INTRAVENOUS
  Administered 2015-10-15: 100 ug via INTRAVENOUS

## 2015-10-15 MED ORDER — FENTANYL CITRATE (PF) 100 MCG/2ML IJ SOLN
INTRAMUSCULAR | Status: AC
  Filled 2015-10-15: qty 2

## 2015-10-15 MED ORDER — BUPIVACAINE HCL (PF) 0.5 % IJ SOLN
INTRAMUSCULAR | Status: AC
  Filled 2015-10-15: qty 30

## 2015-10-15 MED ORDER — ONDANSETRON HCL 4 MG/2ML IJ SOLN
4.0000 mg | Freq: Once | INTRAMUSCULAR | Status: AC | PRN
  Administered 2015-10-15: 4 mg via INTRAVENOUS

## 2015-10-15 MED ORDER — PROPOFOL 10 MG/ML IV BOLUS
INTRAVENOUS | Status: DC | PRN
  Administered 2015-10-15 (×2): 40 mg via INTRAVENOUS
  Administered 2015-10-15 (×4): 50 mg via INTRAVENOUS
  Administered 2015-10-15: 30 mg via INTRAVENOUS
  Administered 2015-10-15: 40 mg via INTRAVENOUS
  Administered 2015-10-15 (×2): 50 mg via INTRAVENOUS

## 2015-10-15 MED ORDER — BUPIVACAINE IN DEXTROSE 0.75-8.25 % IT SOLN
INTRATHECAL | Status: DC | PRN
  Administered 2015-10-15: 1.4 mL via INTRATHECAL

## 2015-10-15 MED ORDER — KETOROLAC TROMETHAMINE 15 MG/ML IJ SOLN
INTRAMUSCULAR | Status: DC | PRN
  Administered 2015-10-15: 15 mg via INTRAVENOUS

## 2015-10-15 MED ORDER — BUPIVACAINE HCL 0.5 % IJ SOLN
INTRAMUSCULAR | Status: DC | PRN
  Administered 2015-10-15: 20 mL

## 2015-10-15 MED ORDER — CEFAZOLIN SODIUM 1-5 GM-% IV SOLN
INTRAVENOUS | Status: DC | PRN
  Administered 2015-10-15: 1 g via INTRAVENOUS

## 2015-10-15 MED ORDER — MIDAZOLAM HCL 2 MG/2ML IJ SOLN
INTRAMUSCULAR | Status: DC | PRN
  Administered 2015-10-15 (×2): 2 mg via INTRAVENOUS

## 2015-10-15 MED ORDER — FENTANYL CITRATE (PF) 100 MCG/2ML IJ SOLN
25.0000 ug | INTRAMUSCULAR | Status: DC | PRN
  Administered 2015-10-15 (×4): 25 ug via INTRAVENOUS

## 2015-10-15 MED ORDER — PROPOFOL 10 MG/ML IV BOLUS: INTRAVENOUS | Status: DC | PRN

## 2015-10-15 MED ORDER — FAMOTIDINE 20 MG PO TABS
20.0000 mg | ORAL_TABLET | Freq: Two times a day (BID) | ORAL | Status: DC
  Administered 2015-10-15 – 2015-10-16 (×3): 20 mg via ORAL
  Filled 2015-10-15 (×3): qty 1

## 2015-10-15 SURGICAL SUPPLY — 34 items
BLADE SURG SZ11 CARB STEEL (BLADE) ×3 IMPLANT
CATH TRAY METER 16FR LF (MISCELLANEOUS) ×3 IMPLANT
CHLORAPREP W/TINT 26ML (MISCELLANEOUS) ×3 IMPLANT
DRAPE LAPAROTOMY 100X77 ABD (DRAPES) ×3 IMPLANT
DRESSING TELFA 4X3 1S ST N-ADH (GAUZE/BANDAGES/DRESSINGS) IMPLANT
DRSG TEGADERM 2-3/8X2-3/4 SM (GAUZE/BANDAGES/DRESSINGS) IMPLANT
ELECT CAUTERY BLADE 6.4 (BLADE) ×3 IMPLANT
ELECT REM PT RETURN 9FT ADLT (ELECTROSURGICAL) ×3
ELECTRODE REM PT RTRN 9FT ADLT (ELECTROSURGICAL) ×1 IMPLANT
GELPOINT ADV PLATFORM (ENDOMECHANICALS) ×3
GLOVE BIOGEL PI IND STRL 6.5 (GLOVE) ×2 IMPLANT
GLOVE BIOGEL PI INDICATOR 6.5 (GLOVE) ×4
GLOVE SURG SYN 6.5 ES PF (GLOVE) ×12 IMPLANT
GOWN STRL REUS W/ TWL LRG LVL3 (GOWN DISPOSABLE) ×2 IMPLANT
GOWN STRL REUS W/TWL LRG LVL3 (GOWN DISPOSABLE) ×4
IRRIGATION STRYKERFLOW (MISCELLANEOUS) ×1 IMPLANT
IRRIGATOR STRYKERFLOW (MISCELLANEOUS) ×3
IV NS 1000ML (IV SOLUTION) ×2
IV NS 1000ML BAXH (IV SOLUTION) ×1 IMPLANT
KIT RM TURNOVER CYSTO AR (KITS) ×3 IMPLANT
LABEL OR SOLS (LABEL) ×3 IMPLANT
LIQUID BAND (GAUZE/BANDAGES/DRESSINGS) ×3 IMPLANT
NDL SAFETY 22GX1.5 (NEEDLE) ×3 IMPLANT
NS IRRIG 500ML POUR BTL (IV SOLUTION) ×3 IMPLANT
PACK BASIN MINOR ARMC (MISCELLANEOUS) ×3 IMPLANT
PLATFORM STD W/COL CELL SVR (ENDOMECHANICALS) ×1 IMPLANT
RETRACTOR WOUND ALXS 18CM SML (MISCELLANEOUS) ×1 IMPLANT
RTRCTR WOUND ALEXIS O 18CM SML (MISCELLANEOUS) ×3
SPONGE LAP 18X18 5 PK (GAUZE/BANDAGES/DRESSINGS) ×3 IMPLANT
SUT MNCRL AB 4-0 PS2 18 (SUTURE) ×3 IMPLANT
SUT PLAIN GUT 0 (SUTURE) ×6 IMPLANT
SUT VIC AB 2-0 UR6 27 (SUTURE) ×6 IMPLANT
SYRINGE 10CC LL (SYRINGE) ×6 IMPLANT
TUBING INSUFFLATOR HI FLOW (MISCELLANEOUS) ×6 IMPLANT

## 2015-10-15 NOTE — Anesthesia Preprocedure Evaluation (Addendum)
Anesthesia Evaluation  Patient identified by MRN, date of birth, ID band Patient awake, Patient confused and Patient unresponsive    Reviewed: Allergy & Precautions, NPO status , Patient's Chart, lab work & pertinent test results  History of Anesthesia Complications Negative for: history of anesthetic complications  Airway Mallampati: III       Dental  (+) Teeth Intact   Pulmonary Current Smoker,           Cardiovascular negative cardio ROS       Neuro/Psych negative neurological ROS  negative psych ROS   GI/Hepatic GERD  Controlled and Medicated,  Endo/Other  diabetes, Gestational, Oral Hypoglycemic Agents  Renal/GU   negative genitourinary   Musculoskeletal negative musculoskeletal ROS (+)   Abdominal   Peds  Hematology negative hematology ROS (+)   Anesthesia Other Findings Past Medical History:   Herpes genitalis                                             Reproductive/Obstetrics negative OB ROS                            Anesthesia Physical Anesthesia Plan  ASA: II  Anesthesia Plan: Spinal   Post-op Pain Management:    Induction:   Airway Management Planned:   Additional Equipment:   Intra-op Plan:   Post-operative Plan:   Informed Consent:   Plan Discussed with: Anesthesiologist, CRNA and Surgeon  Anesthesia Plan Comments:         Anesthesia Quick Evaluation

## 2015-10-15 NOTE — Anesthesia Procedure Notes (Signed)
Spinal Patient location during procedure: OR Start time: 10/15/2015 8:18 AM End time: 10/15/2015 8:20 AM Staffing Anesthesiologist: Martha Clan Resident/CRNA: Clinton Sawyer Performed by: resident/CRNA  Preanesthetic Checklist Completed: patient identified, site marked, surgical consent, pre-op evaluation, timeout performed, IV checked, risks and benefits discussed and monitors and equipment checked Spinal Block Patient position: sitting Prep: ChloraPrep Patient monitoring: heart rate, continuous pulse ox, blood pressure and cardiac monitor Approach: midline Location: L3-4 Injection technique: single-shot Needle Needle type: Whitacre and Introducer  Needle gauge: 25 G Needle length: 9 cm Additional Notes Negative paresthesia. Negative blood return. Positive free-flowing CSF. Expiration date of kit checked and confirmed. Patient tolerated procedure well, without complications.

## 2015-10-15 NOTE — Op Note (Signed)
  Post Partum Tubal Ligation  Indication for procedure: desired permanent sterilization  Pre-op diagnosis: s/p term vaginal delivery, desired permanent sterilization Post-op diagnosis: same Procedure: post partum open converted to laparoscopic bilateral salpingectomy Surgeon: Leeroy Bockhelsea Marilin Kofman Assist: none Anesthesia: spinal IVF: 2300cc LR EBL: minimal (20cc) UOP: 500cc Findings: normal appearing bilateral tubes with fatty mesosalpinx, normal post-gravid uterine fundus Specimens: right and left tubes in their full length including fimbriated ends Complications: none apparent Disposition: stable to PACU  Procedure in detail: The patient was seen and confirmed desire for permanent sterilization, via salpingectomy and not tubal ligation.  She was identified as Scientist, research (medical)Dakoda L Frisinger and was brought to the OR.  Spinal anesthesia was administered, foley catheter was placed, and the patient was prepped and draped in the usual sterile fashion.  A surgical time-out was called.  An 11-blade was used to incise the skin in the infraumbilical area.  The subcutaneous tissues were dissected to and then through the fascia, and the peritoneum was grasped and divided.  An alexis retractor was placed in the abdominal cavity and positioned.  The uterus was identified, but deep in the pelvic cavity.  A lap sponge was placed in the abdomen to attempt to retract the bowel and omentum and patient placed in trendelenburg, but with these measures the uterus was still far from the surgical field.  The bladder was backfilled with 300cc of saline and while this brought the uterus closer to the incision, manipulation to reach the tubes was limited.  Thus, the decision to convert to a single-site laparoscopy was made.  The lap sponge was removed from the abdominal cavity. The Alexis retractor was removed and the GelPointe device was assembled and inserted.  Pneumoperitoneum was inflated to 10mmHg given the patient was not under general  anesthesia, and a camera was inserted.  The right tube was sealed and divided at the cornua of the uterus, and along the mesosalpinx to the fimbriated end.  The surrounding tissues were hemostatic.  The tube was grasped and removed through the incision.  The GelPointe was reassembled, pneumoperitoneum recreated and the left tube was removed in the identical fashion. The GelPointe and its retractor were then removed, and some blood was noted at the incision.  The device was again reassembled and survey of the surgical field was performed.  All sites were hemostatic.  The device and retractor were removed and the bleeding was noted to be from the fascia, and not intra-abdominally.  The fascia was closed with a running stitch of 2-0 vicryl and tested for integrity, and was hemostatic.  The skin was closed with 4-0 Monocryl and covered with surgical glue.  The sponge, needle, and instrument counts were correct x2.  The patient tolerated the procedure and was brought to PACU in a stable condition.  Ranae Plumberhelsea Shanaiya Bene, MD Attending Obstetrician and Gynecologist [facility]

## 2015-10-15 NOTE — Transfer of Care (Signed)
Immediate Anesthesia Transfer of Care Note  Patient: Jasmine SalterAmber L Marquis  Procedure(s) Performed: Procedure(s): bilateral postpartum salpingectomy changed to bilateral laparoscopic postpartum salpingectomy (Bilateral)  Patient Location: PACU  Anesthesia Type:Spinal  Level of Consciousness: awake, alert  and oriented  Airway & Oxygen Therapy: Patient Spontanous Breathing and Patient connected to nasal cannula oxygen  Post-op Assessment: Report given to RN and Post -op Vital signs reviewed and stable  Post vital signs: Reviewed and stable  Last Vitals:  Filed Vitals:   10/15/15 0409 10/15/15 0730  BP: 99/48 112/74  Pulse: 60 69  Temp: 36.9 C 36.6 C  Resp: 20 17    Last Pain:  Filed Vitals:   10/15/15 0730  PainSc: 4          Complications: No apparent anesthesia complications

## 2015-10-15 NOTE — Anesthesia Postprocedure Evaluation (Signed)
Anesthesia Post Note  Patient: Jasmine Hale  Procedure(s) Performed: Procedure(s) (LRB): bilateral postpartum salpingectomy changed to bilateral laparoscopic postpartum salpingectomy (Bilateral)  Patient location during evaluation: PACU Anesthesia Type: General Level of consciousness: awake and alert Pain management: pain level controlled Vital Signs Assessment: post-procedure vital signs reviewed and stable Respiratory status: spontaneous breathing, nonlabored ventilation, respiratory function stable and patient connected to nasal cannula oxygen Cardiovascular status: blood pressure returned to baseline and stable Postop Assessment: no signs of nausea or vomiting Anesthetic complications: no    Last Vitals:  Filed Vitals:   10/15/15 1110 10/15/15 1137  BP: 94/66 116/62  Pulse: 72 66  Temp:  36.5 C  Resp: 16 18    Last Pain:  Filed Vitals:   10/15/15 1240  PainSc: 6                  Lenard SimmerAndrew Kamill Fulbright

## 2015-10-15 NOTE — Progress Notes (Signed)
Post Partum Day 1 Subjective: Doing well, no complaints.  Tolerating regular diet, pain with PO meds, voiding and ambulating without difficulty. She has been NPO since midnight and still desirous of permanent sterilization.  No CP SOB F/C N/V or leg pain   Objective: BP 112/74 mmHg  Pulse 69  Temp(Src) 97.8 F (36.6 C) (Oral)  Resp 17  SpO2 100%    Physical Exam:  General: NAD CV: RRR Pulm: nl effort, CTABL Lochia: moderate Uterine Fundus: fundus firm and 1cm below umbilicus DVT Evaluation: no cords, ttp LEs    Recent Labs  10/14/15 0156  HGB 11.2*  HCT 33.0*  WBC 14.1*  PLT 154    Assessment/Plan: 30 y.o. [id number] postpartum day # 1 s/p SVD  1.  Post partum:  Continue routine post partum cares 2.  Sterilization:  Conversation had with patient about options of partial tubal ligation vs. Bilateral salpingectomy.  Risks of each were covered including ectopic, IUP, fallopian tube cancer, and patient elects for salpingectomy.  Will send to OR when ready.   3.  Dispo:  Likely discharge tomorrow due to neonate observation.  Will check with peds.  Otherwise may be able to go home later today if doing ok.    ----- Ranae Plumberhelsea Ward, MD Attending Obstetrician and Gynecologist [facility]

## 2015-10-16 MED ORDER — OXYCODONE-ACETAMINOPHEN 5-325 MG PO TABS: 1.0000 | ORAL_TABLET | ORAL | Status: DC | PRN

## 2015-10-16 MED ORDER — IBUPROFEN 600 MG PO TABS: 600.0000 mg | ORAL_TABLET | Freq: Four times a day (QID) | ORAL | Status: DC | PRN

## 2015-10-16 NOTE — Discharge Instructions (Signed)
Discharge instructions:   Call office if you have any of the following: headache, visual changes, fever >101.0 F, chills, breast concerns, excessive vaginal bleeding, incision drainage or problems, leg pain or redness, depression or any other concerns.   Activity: Do not lift > 10 lbs for 6 weeks.  No intercourse or tampons for 6 weeks.  No driving for 1-2 weeks.   Call your doctor for increased pain or vaginal bleeding, temperature above 101.0, depression, or concerns.  No strenuous activity or heavy lifting for 6 weeks.  No intercourse, tampons, douching, or enemas for 6 weeks.  No tub baths-showers only.  No driving for 2 weeks or while taking pain medications.  Continue prenatal vitamin and iron.    You may have a slight fever when your milk comes in, but it should go away on its own.  If it does not, and rises above 101.0 please call the doctor.  For concerns about your baby, please call your pediatrician For breast or breastfeeding concerns, the lactation consultant can be reached at [phone] Follow up sooner with fever, problems breathing, pain not helped by medications, severe depression( more than just baby blues, wanting to hurt yourself or the baby), severe bleeding ( saturating more than one pad an hour or large palm sized clots), no heavy lifting , no driving while taking narcotics, no douches, intercourse, tampons or enemas for 6 weeks

## 2015-10-16 NOTE — Progress Notes (Signed)
All discharge instructions reviewed with patient and she voiced understanding of all instructions given.  Prescription given.  She will make her own f/u appt for 6 wks from now.  Patient discharged home with infant escorted out by RN with infant in arms in a wheelchair.

## 2015-10-17 ENCOUNTER — Encounter: Payer: Self-pay | Admitting: Obstetrics & Gynecology

## 2015-10-18 LAB — SURGICAL PATHOLOGY

## 2016-01-05 DIAGNOSIS — F411 Generalized anxiety disorder: Secondary | ICD-10-CM | POA: Insufficient documentation

## 2016-01-05 DIAGNOSIS — G43009 Migraine without aura, not intractable, without status migrainosus: Secondary | ICD-10-CM | POA: Insufficient documentation

## 2016-02-17 ENCOUNTER — Ambulatory Visit
Admission: EM | Admit: 2016-02-17 | Discharge: 2016-02-17 | Disposition: A | Discharge status: Home / Self Care | Payer: Medicaid Other | Attending: Family Medicine | Admitting: Family Medicine

## 2016-02-17 DIAGNOSIS — E876 Hypokalemia: Secondary | ICD-10-CM | POA: Insufficient documentation

## 2016-02-17 DIAGNOSIS — R42 Dizziness and giddiness: Secondary | ICD-10-CM

## 2016-02-17 DIAGNOSIS — R002 Palpitations: Secondary | ICD-10-CM | POA: Diagnosis not present

## 2016-02-17 DIAGNOSIS — F17210 Nicotine dependence, cigarettes, uncomplicated: Secondary | ICD-10-CM | POA: Insufficient documentation

## 2016-02-17 LAB — CBC WITH DIFFERENTIAL/PLATELET
BASOS ABS: 0.1 10*3/uL (ref 0–0.1)
BASOS PCT: 1 %
EOS PCT: 2 %
Eosinophils Absolute: 0.2 10*3/uL (ref 0–0.7)
HCT: 39.6 % (ref 35.0–47.0)
Hemoglobin: 13.4 g/dL (ref 12.0–16.0)
Lymphocytes Relative: 31 %
Lymphs Abs: 2.8 10*3/uL (ref 1.0–3.6)
MCH: 29.6 pg (ref 26.0–34.0)
MCHC: 33.9 g/dL (ref 32.0–36.0)
MCV: 87.5 fL (ref 80.0–100.0)
MONO ABS: 0.4 10*3/uL (ref 0.2–0.9)
Monocytes Relative: 5 %
Neutro Abs: 5.6 10*3/uL (ref 1.4–6.5)
Neutrophils Relative %: 61 %
PLATELETS: 155 10*3/uL (ref 150–440)
RBC: 4.53 MIL/uL (ref 3.80–5.20)
RDW: 14.7 % — AB (ref 11.5–14.5)
WBC: 9.1 10*3/uL (ref 3.6–11.0)

## 2016-02-17 LAB — BASIC METABOLIC PANEL
ANION GAP: 9 (ref 5–15)
BUN: 5 mg/dL — ABNORMAL LOW (ref 6–20)
CALCIUM: 8.8 mg/dL — AB (ref 8.9–10.3)
CO2: 21 mmol/L — ABNORMAL LOW (ref 22–32)
CREATININE: 0.75 mg/dL (ref 0.44–1.00)
Chloride: 107 mmol/L (ref 101–111)
GLUCOSE: 114 mg/dL — AB (ref 65–99)
Potassium: 3.4 mmol/L — ABNORMAL LOW (ref 3.5–5.1)
Sodium: 137 mmol/L (ref 135–145)

## 2016-02-17 MED ORDER — POTASSIUM CHLORIDE CRYS ER 20 MEQ PO TBCR
20.0000 meq | EXTENDED_RELEASE_TABLET | Freq: Once | ORAL | Status: AC
  Administered 2016-02-17: 20 meq via ORAL

## 2016-02-17 NOTE — ED Triage Notes (Signed)
Pt started yesterday with lightheadedness on waking, and worse with position changes. Feels like her "heart is racing"  At times and SOB at times. Denies pain or syncope.

## 2016-02-17 NOTE — ED Provider Notes (Signed)
[facility]    CSN: [id number] Arrival date & time: 02/17/16  1029     History   Chief Complaint Chief Complaint  Patient presents with  . Dizziness    HPI Jasmine Hale is a 30 y.o. female.   The history is provided by the patient.  Dizziness  Quality:  Lightheadedness Severity:  Moderate Onset quality:  Sudden Duration:  2 days Timing:  Intermittent Chronicity:  New Context: bending over   Context: not with bowel movement, not with ear pain, not with eye movement, not with head movement, not with inactivity, not with loss of consciousness, not with medication, not with physical activity, not when standing up and not when urinating   Relieved by:  None tried Ineffective treatments:  None tried Associated symptoms: palpitations and shortness of breath   Associated symptoms: no blood in stool, no chest pain, no diarrhea, no headaches, no hearing loss, no nausea, no syncope, no tinnitus, no vision changes, no vomiting and no weakness   Risk factors: no anemia, no heart disease, no hx of stroke, no hx of vertigo, no Meniere's disease, no multiple medications and no new medications     Past Medical History:  Diagnosis Date  . Herpes genitalis     Patient Active Problem List   Diagnosis Date Noted  . Dizziness 02/17/2016  . Postpartum care following vaginal delivery 10/16/2015  . Supervision of high risk pregnancy in third trimester 10/14/2015  . Gestational diabetes mellitus (GDM) in third trimester controlled on oral hypoglycemic drug 10/14/2015  . Labor and delivery indication for care or intervention 10/14/2015  . Obesity in pregnancy 10/14/2015  . Smoker 10/14/2015  . UTI (urinary tract infection) in pregnancy, antepartum 10/14/2015  . Chronic pain due to trauma 10/14/2015  . 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 ORTHOPEDIC SURGERY     Hand, Pelvis, and arm had pins placed   . TUBAL LIGATION  Bilateral 10/15/2015   Procedure: bilateral postpartum salpingectomy changed to bilateral laparoscopic postpartum salpingectomy;  Surgeon: Elenora Fender Ward, MD;  Location: [facility] ORS;  Service: Gynecology;  Laterality: Bilateral;    OB History    Gravida Para Term Preterm AB Living   5 4 2 2 1 4    SAB TAB Ectopic Multiple Live Births   1     0 4       Home Medications    Prior to Admission medications   Medication Sig Start Date End Date Taking? Authorizing Provider  ibuprofen (ADVIL,MOTRIN) 600 MG tablet Take 1 tablet (600 mg total) by mouth every 6 (six) hours as needed for moderate pain. 10/16/15   Chelsea C Ward, MD  oxyCODONE-acetaminophen (PERCOCET/ROXICET) 5-325 MG tablet Take 1 tablet by mouth every 4 (four) hours as needed for moderate pain or severe pain. 10/16/15   Chelsea C Ward, MD  Prenatal Vit-Fe Fumarate-FA (MULTIVITAMIN-PRENATAL) 27-0.8 MG TABS tablet Take 1 tablet by mouth daily at 12 noon.    Historical Provider, MD    Family History History reviewed. No pertinent family history.  Social History Social History  Substance Use Topics  . Smoking status: Current Every Day Smoker    Packs/day: 0.50    Types: Cigarettes  . Smokeless tobacco: Never Used  . Alcohol use No     Allergies   Sulfa antibiotics   Review of Systems Review of Systems  HENT: Negative for hearing loss and tinnitus.   Respiratory: Positive for shortness  of breath.   Cardiovascular: Positive for palpitations. Negative for chest pain and syncope.  Gastrointestinal: Negative for blood in stool, diarrhea, nausea and vomiting.  Neurological: Positive for dizziness. Negative for weakness and headaches.     Physical Exam Triage Vital Signs ED Triage Vitals  Enc Vitals Group     BP 02/17/16 1102 127/83     Pulse Rate 02/17/16 1102 74     Resp 02/17/16 1102 20     Temp 02/17/16 1102 98.4 F (36.9 C)     Temp src --      SpO2 02/17/16 1102 100 %     Weight 02/17/16 1103 196 lb (88.9 kg)      Height 02/17/16 1103 5\' 6"  (1.676 m)     Head Circumference --      Peak Flow --      Pain Score --      Pain Loc --      Pain Edu? --      Excl. in GC? --    No data found.   Updated Vital Signs BP 127/83 (BP Location: Right Arm)   Pulse 74   Temp 98.4 F (36.9 C)   Resp 20   Ht 5\' 6"  (1.676 m)   Wt 196 lb (88.9 kg)   LMP 02/03/2016   SpO2 100%   BMI 31.64 kg/m   Visual Acuity Right Eye Distance:   Left Eye Distance:   Bilateral Distance:    Right Eye Near:   Left Eye Near:    Bilateral Near:     Physical Exam  Constitutional: She appears well-developed and well-nourished. No distress.  HENT:  Head: Normocephalic and atraumatic.  Right Ear: Tympanic membrane, external ear and ear canal normal.  Left Ear: Tympanic membrane, external ear and ear canal normal.  Nose: No mucosal edema, rhinorrhea, nose lacerations, sinus tenderness, nasal deformity, septal deviation or nasal septal hematoma. No epistaxis.  No foreign bodies. Right sinus exhibits no maxillary sinus tenderness and no frontal sinus tenderness. Left sinus exhibits no maxillary sinus tenderness and no frontal sinus tenderness.  Mouth/Throat: Uvula is midline, oropharynx is clear and moist and mucous membranes are normal. No oropharyngeal exudate.  Eyes: Conjunctivae and EOM are normal. Pupils are equal, round, and reactive to light. Right eye exhibits no discharge. Left eye exhibits no discharge. No scleral icterus.  Neck: Normal range of motion. Neck supple. No thyromegaly present.  Cardiovascular: Normal rate, regular rhythm and normal heart sounds.   Pulmonary/Chest: Effort normal and breath sounds normal. No respiratory distress. She has no wheezes. She has no rales.  Lymphadenopathy:    She has no cervical adenopathy.  Skin: She is not diaphoretic.  Nursing note and vitals reviewed.    UC Treatments / Results  Labs (all labs ordered are listed, but only abnormal results are displayed) Labs  Reviewed  CBC WITH DIFFERENTIAL/PLATELET - Abnormal; Notable for the following:       Result Value   RDW 14.7 (*)    All other components within normal limits  BASIC METABOLIC PANEL - Abnormal; Notable for the following:    Potassium 3.4 (*)    CO2 21 (*)    Glucose, Bld 114 (*)    BUN 5 (*)    Calcium 8.8 (*)    All other components within normal limits    EKG  EKG Interpretation None       Radiology No results found.  Procedures .EKG Date/Time: 02/17/2016 11:38 AM Performed by: Judd Gaudier,  Lavine Hargrove Authorized by: Payton MccallumONTY, Roselina Burgueno   ECG reviewed by ED Physician in the absence of a cardiologist: yes   Previous ECG:    Previous ECG:  Unavailable Interpretation:    Interpretation: normal   Rate:    ECG rate assessment: normal   Rhythm:    Rhythm: sinus rhythm   Ectopy:    Ectopy: none   QRS:    QRS axis:  Normal Conduction:    Conduction: normal   ST segments:    ST segments:  Normal T waves:    T waves: normal      (including critical care time)  Medications Ordered in UC Medications  potassium chloride SA (K-DUR,KLOR-CON) CR tablet 20 mEq (20 mEq Oral Given 02/17/16 1207)     Initial Impression / Assessment and Plan / UC Course  I have reviewed the triage vital signs and the nursing notes.  Pertinent labs & imaging results that were available during my care of the patient were reviewed by me and considered in my medical decision making (see chart for details).  Clinical Course      Final Clinical Impressions(s) / UC Diagnoses   Final diagnoses:  Dizziness  Palpitations  Hypokalemia    New Prescriptions New Prescriptions   No medications on file   1. Labs/ekg results and diagnosis reviewed with patient 2. Recommend supportive treatment with increased fluids and potassium rich foods 3. Patient given kcl 20 MEQ po x 1 4. Follow-up prn if symptoms worsen or don't improve   Payton Mccallumrlando Taylin Mans, MD 02/17/16 1210

## 2016-03-09 ENCOUNTER — Ambulatory Visit
Admission: EM | Admit: 2016-03-09 | Discharge: 2016-03-09 | Disposition: A | Discharge status: Home / Self Care | Payer: Medicaid Other | Attending: Family Medicine | Admitting: Family Medicine

## 2016-03-09 ENCOUNTER — Encounter: Payer: Self-pay | Admitting: Emergency Medicine

## 2016-03-09 DIAGNOSIS — J069 Acute upper respiratory infection, unspecified: Secondary | ICD-10-CM | POA: Diagnosis not present

## 2016-03-09 DIAGNOSIS — B9789 Other viral agents as the cause of diseases classified elsewhere: Secondary | ICD-10-CM | POA: Diagnosis not present

## 2016-03-09 MED ORDER — GUAIFENESIN-CODEINE 100-10 MG/5ML PO SOLN: ORAL | 0 refills | Status: DC

## 2016-03-09 MED ORDER — BENZONATATE 100 MG PO CAPS: 100.0000 mg | ORAL_CAPSULE | Freq: Three times a day (TID) | ORAL | 0 refills | Status: DC

## 2016-03-09 NOTE — ED Triage Notes (Signed)
Pt reports non-productive cough X2 days reports causing rib pain. Pt denies fever or sore throat reports sinus congestion

## 2016-03-09 NOTE — ED Provider Notes (Signed)
[facility]    CSN: [id number] Arrival date & time: 03/09/16  [id number]     History   Chief Complaint Chief Complaint  Patient presents with  . Cough    HPI Jasmine Hale is a 30 y.o. female.   The history is provided by the patient.  URI  Presenting symptoms: congestion, cough and rhinorrhea   Severity:  Moderate Onset quality:  Sudden Timing:  Constant Progression:  Worsening Chronicity:  New Relieved by:  Nothing Ineffective treatments:  OTC medications Associated symptoms: no arthralgias, no headaches, no myalgias, no neck pain, no sinus pain, no sneezing, no swollen glands and no wheezing   Risk factors: sick contacts   Risk factors: not elderly, no chronic cardiac disease, no chronic kidney disease, no chronic respiratory disease, no diabetes mellitus, no immunosuppression, no recent illness and no recent travel     Past Medical History:  Diagnosis Date  . Herpes genitalis     Patient Active Problem List   Diagnosis Date Noted  . Dizziness 02/17/2016  . Postpartum care following vaginal delivery 10/16/2015  . Supervision of high risk pregnancy in third trimester 10/14/2015  . Gestational diabetes mellitus (GDM) in third trimester controlled on oral hypoglycemic drug 10/14/2015  . Labor and delivery indication for care or intervention 10/14/2015  . Obesity in pregnancy 10/14/2015  . Smoker 10/14/2015  . UTI (urinary tract infection) in pregnancy, antepartum 10/14/2015  . Chronic pain due to trauma 10/14/2015  . 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 ORTHOPEDIC SURGERY     Hand, Pelvis, and arm had pins placed   . TUBAL LIGATION Bilateral 10/15/2015   Procedure: bilateral postpartum salpingectomy changed to bilateral laparoscopic postpartum salpingectomy;  Surgeon: Elenora Fenderhelsea C Ward, MD;  Location: [facility] ORS;  Service: Gynecology;  Laterality: Bilateral;    OB History    Gravida Para Term Preterm AB  Living   5 4 2 2 1 4    SAB TAB Ectopic Multiple Live Births   1     0 4       Home Medications    Prior to Admission medications   Medication Sig Start Date End Date Taking? Authorizing Provider  benzonatate (TESSALON) 100 MG capsule Take 1 capsule (100 mg total) by mouth every 8 (eight) hours. 03/09/16   Payton Mccallumrlando Johann Gascoigne, MD  guaiFENesin-codeine 100-10 MG/5ML syrup 10 ml po bid prn cough 03/09/16   Payton Mccallumrlando Kolsen Choe, MD  ibuprofen (ADVIL,MOTRIN) 600 MG tablet Take 1 tablet (600 mg total) by mouth every 6 (six) hours as needed for moderate pain. 10/16/15   Chelsea C Ward, MD  oxyCODONE-acetaminophen (PERCOCET/ROXICET) 5-325 MG tablet Take 1 tablet by mouth every 4 (four) hours as needed for moderate pain or severe pain. 10/16/15   Chelsea C Ward, MD  Prenatal Vit-Fe Fumarate-FA (MULTIVITAMIN-PRENATAL) 27-0.8 MG TABS tablet Take 1 tablet by mouth daily at 12 noon.    Historical Provider, MD    Family History No family history on file.  Social History Social History  Substance Use Topics  . Smoking status: Current Every Day Smoker    Packs/day: 1.00    Types: Cigarettes  . Smokeless tobacco: Never Used  . Alcohol use No     Allergies   Sulfa antibiotics   Review of Systems Review of Systems  HENT: Positive for congestion and rhinorrhea. Negative for sneezing.   Respiratory: Positive for cough. Negative for wheezing.   Musculoskeletal: Negative for arthralgias, myalgias  and neck pain.  Neurological: Negative for headaches.     Physical Exam Triage Vital Signs ED Triage Vitals  Enc Vitals Group     BP 03/09/16 0829 122/86     Pulse Rate 03/09/16 0829 76     Resp 03/09/16 0829 16     Temp 03/09/16 0829 98.1 F (36.7 C)     Temp Source 03/09/16 0829 Tympanic     SpO2 03/09/16 0829 99 %     Weight 03/09/16 0829 196 lb (88.9 kg)     Height 03/09/16 0829 5\' 6"  (1.676 m)     Head Circumference --      Peak Flow --      Pain Score 03/09/16 0831 8     Pain Loc --      Pain  Edu? --      Excl. in GC? --    No data found.   Updated Vital Signs BP 122/86 (BP Location: Left Arm)   Pulse 76   Temp 98.1 F (36.7 C) (Tympanic)   Resp 16   Ht 5\' 6"  (1.676 m)   Wt 196 lb (88.9 kg)   LMP 03/01/2016   SpO2 99%   BMI 31.64 kg/m   Visual Acuity Right Eye Distance:   Left Eye Distance:   Bilateral Distance:    Right Eye Near:   Left Eye Near:    Bilateral Near:     Physical Exam  Constitutional: She appears well-developed and well-nourished. No distress.  HENT:  Head: Normocephalic and atraumatic.  Right Ear: Tympanic membrane, external ear and ear canal normal.  Left Ear: Tympanic membrane, external ear and ear canal normal.  Nose: Rhinorrhea present. No mucosal edema, nose lacerations, sinus tenderness, nasal deformity, septal deviation or nasal septal hematoma. No epistaxis.  No foreign bodies. Right sinus exhibits no maxillary sinus tenderness and no frontal sinus tenderness. Left sinus exhibits no maxillary sinus tenderness and no frontal sinus tenderness.  Mouth/Throat: Uvula is midline, oropharynx is clear and moist and mucous membranes are normal. No oropharyngeal exudate.  Eyes: Conjunctivae and EOM are normal. Pupils are equal, round, and reactive to light. Right eye exhibits no discharge. Left eye exhibits no discharge. No scleral icterus.  Neck: Normal range of motion. Neck supple. No thyromegaly present.  Cardiovascular: Normal rate, regular rhythm and normal heart sounds.   Pulmonary/Chest: Effort normal and breath sounds normal. No respiratory distress. She has no wheezes. She has no rales.  Lymphadenopathy:    She has no cervical adenopathy.  Skin: She is not diaphoretic.  Nursing note and vitals reviewed.    UC Treatments / Results  Labs (all labs ordered are listed, but only abnormal results are displayed) Labs Reviewed - No data to display  EKG  EKG Interpretation None       Radiology No results  found.  Procedures Procedures (including critical care time)  Medications Ordered in UC Medications - No data to display   Initial Impression / Assessment and Plan / UC Course  I have reviewed the triage vital signs and the nursing notes.  Pertinent labs & imaging results that were available during my care of the patient were reviewed by me and considered in my medical decision making (see chart for details).  Clinical Course      Final Clinical Impressions(s) / UC Diagnoses   Final diagnoses:  Viral URI with cough    New Prescriptions Discharge Medication List as of 03/09/2016  8:43 AM    START  taking these medications   Details  benzonatate (TESSALON) 100 MG capsule Take 1 capsule (100 mg total) by mouth every 8 (eight) hours., Starting Fri 03/09/2016, Normal    guaiFENesin-codeine 100-10 MG/5ML syrup 10 ml po bid prn cough, Print       1. diagnosis reviewed with patient 2. rx as per orders above; reviewed possible side effects, interactions, risks and benefits  3. Recommend supportive treatment with increased fluids, otc analgesics  4. Follow-up prn if symptoms worsen or don't improve   Payton Mccallum, MD 03/09/16 [phone]

## 2016-03-13 ENCOUNTER — Telehealth: Payer: Self-pay

## 2016-03-13 NOTE — Telephone Encounter (Signed)
Courtesy call back completed today for patients recent visit [facility]. Patient did not answer. After several attempts.

## 2016-03-27 ENCOUNTER — Ambulatory Visit
Admission: EM | Admit: 2016-03-27 | Discharge: 2016-03-27 | Disposition: A | Discharge status: Home / Self Care | Payer: Medicaid Other | Attending: Family Medicine | Admitting: Family Medicine

## 2016-03-27 DIAGNOSIS — G8921 Chronic pain due to trauma: Secondary | ICD-10-CM | POA: Insufficient documentation

## 2016-03-27 DIAGNOSIS — B009 Herpesviral infection, unspecified: Secondary | ICD-10-CM | POA: Insufficient documentation

## 2016-03-27 DIAGNOSIS — F17210 Nicotine dependence, cigarettes, uncomplicated: Secondary | ICD-10-CM | POA: Diagnosis not present

## 2016-03-27 DIAGNOSIS — E669 Obesity, unspecified: Secondary | ICD-10-CM | POA: Diagnosis not present

## 2016-03-27 DIAGNOSIS — R42 Dizziness and giddiness: Secondary | ICD-10-CM | POA: Insufficient documentation

## 2016-03-27 DIAGNOSIS — L02415 Cutaneous abscess of right lower limb: Secondary | ICD-10-CM

## 2016-03-27 MED ORDER — HYDROCODONE-ACETAMINOPHEN 5-325 MG PO TABS: 1.0000 | ORAL_TABLET | Freq: Three times a day (TID) | ORAL | 0 refills | Status: DC | PRN

## 2016-03-27 MED ORDER — DOXYCYCLINE HYCLATE 100 MG PO CAPS: 100.0000 mg | ORAL_CAPSULE | Freq: Two times a day (BID) | ORAL | 0 refills | Status: DC

## 2016-03-27 MED ORDER — MUPIROCIN 2 % EX OINT: 1.0000 "application " | TOPICAL_OINTMENT | Freq: Three times a day (TID) | CUTANEOUS | 0 refills | Status: DC

## 2016-03-27 NOTE — ED Triage Notes (Signed)
Patient reports that she has a boil/abscess on the crease of her groin. Patient reports that she has a great deal of pain with walking. Patient reports that onset of symptoms started yesterday.
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
## 2016-03-30 ENCOUNTER — Encounter: Payer: Self-pay | Admitting: Emergency Medicine

## 2016-03-30 ENCOUNTER — Ambulatory Visit
Admission: EM | Admit: 2016-03-30 | Discharge: 2016-03-30 | Disposition: A | Discharge status: Home / Self Care | Payer: Medicaid Other | Attending: Family Medicine | Admitting: Family Medicine

## 2016-03-30 DIAGNOSIS — Z5189 Encounter for other specified aftercare: Secondary | ICD-10-CM

## 2016-03-30 MED ORDER — HYDROCODONE-ACETAMINOPHEN 5-325 MG PO TABS: 1.0000 | ORAL_TABLET | Freq: Four times a day (QID) | ORAL | 0 refills | Status: AC | PRN

## 2016-03-30 NOTE — ED Triage Notes (Signed)
Patient here for wound check for her abscess and possible repacking.

## 2016-03-30 NOTE — ED Provider Notes (Signed)
CSN: [id number]     Arrival date & time 03/30/16  [id number] History   First MD Initiated Contact with Patient 03/30/16 [phone]     Chief Complaint  Patient presents with  . Abscess   (Consider location/radiation/quality/duration/timing/severity/associated sxs/prior Treatment) Jasmine Hale is a well-appearing 30 y.o female, presents today for abscess wound rechecking and for packing removal of her right upper thigh. Patient had a I&D on 3 days ago and was put on doxycycline, Vicodin and Bactroban ointment. Patient reports the size of the abscess has gone down. She reports some drainage on her dressing. Patient reports pain is tolerable on Vicodin, but she is almost out of the Vicodin and would another prescriptions since she is still experiencing a lot of pain. Patient denies fever or chills. She also denies SOB, CP.       Past Medical History:  Diagnosis Date  . Herpes genitalis    Past Surgical History:  Procedure Laterality Date  . ORTHOPEDIC SURGERY     Hand, Pelvis, and arm had pins placed   . TUBAL LIGATION Bilateral 10/15/2015   Procedure: bilateral postpartum salpingectomy changed to bilateral laparoscopic postpartum salpingectomy;  Surgeon: Elenora Fenderhelsea C Ward, MD;  Location: [facility] OR[facility];  Service: Gynecology;  Laterality: Bilateral;   History reviewed. No pertinent family history. Social History  Substance Use Topics  . Smoking status: Current Every Day Smoker    Packs/day: 1.00    Types: Cigarettes  . Smokeless tobacco: Never Used  . Alcohol use No   OB History    Gravida Para Term Preterm AB Living   5 4 2 2 1 4    SAB TAB Ectopic Multiple Live Births   1     0 4     Review of Systems  All other systems reviewed and are negative.   Allergies  Sulfa antibiotics  Home Medications   Prior to Admission medications   Medication Sig Start Date End Date Taking? Authorizing Provider  benzonatate (TESSALON) 100 MG capsule Take 1 capsule (100 mg total) by mouth every 8 (eight)  hours. 03/09/16   Payton Mccallumrlando Conty, MD  doxycycline (VIBRAMYCIN) 100 MG capsule Take 1 capsule (100 mg total) by mouth 2 (two) times daily. 03/27/16   Hassan RowanEugene Wade, MD  guaiFENesin-codeine 100-10 MG/5ML syrup 10 ml po bid prn cough 03/09/16   Payton Mccallumrlando Conty, MD  HYDROcodone-acetaminophen (NORCO/VICODIN) 5-325 MG tablet Take 1 tablet by mouth every 6 (six) hours as needed. 03/30/16 04/02/16  Lucia EstelleFeng Saren Corkern, NP  ibuprofen (ADVIL,MOTRIN) 600 MG tablet Take 1 tablet (600 mg total) by mouth every 6 (six) hours as needed for moderate pain. 10/16/15   Chelsea C Ward, MD  mupirocin ointment (BACTROBAN) 2 % Apply 1 application topically 3 (three) times daily. 03/27/16   Hassan RowanEugene Wade, MD  oxyCODONE-acetaminophen (PERCOCET/ROXICET) 5-325 MG tablet Take 1 tablet by mouth every 4 (four) hours as needed for moderate pain or severe pain. 10/16/15   Chelsea C Ward, MD  Prenatal Vit-Fe Fumarate-FA (MULTIVITAMIN-PRENATAL) 27-0.8 MG TABS tablet Take 1 tablet by mouth daily at 12 noon.    Historical Provider, MD   Meds Ordered and Administered this Visit  Medications - No data to display  BP 115/76 (BP Location: Left Arm)   Pulse 91   Temp 98.7 F (37.1 C) (Oral)   Resp 16   Ht 5\' 6"  (1.676 m)   Wt 195 lb (88.5 kg)   LMP 03/24/2016   SpO2 99%   BMI 31.47 kg/m  No data found.  Physical Exam  Constitutional: She is oriented to person, place, and time. She appears well-developed and well-nourished.  Cardiovascular: Normal rate.   Pulmonary/Chest: Effort normal and breath sounds normal.  Neurological: She is alert and oriented to person, place, and time.  Skin:  An open wound approximately 1.5cm in size noted on right upper thigh. Packing removed with small amt of serosanguinous drainage noted on the packing. Still has some induration in the area; induration measures approximately 2.4cm. Area tender to touch. No surrounding erythema noted    Urgent Care Course   Clinical Course     Procedures (including critical  care time)  Labs Review Labs Reviewed - No data to display  Imaging Review No results found.   MDM   1. Wound check, abscess    1) Packing removed, small amt of drainage noted on the packing. No indication for repacking today.  2) Wound cleaned, New 4x4 island dressing applied 3) [place] Controlled substances database reviewed; I am fine with giving her another prescription for Vicodin 4) Continue to take antibiotic until the bottle is finished 5) Start applying Bactroban ointment on the area 6) Take Vicodin PRN for pain 7) Continue to apply warm compress on the area 8) Return or f/u with PCP if abscess gets worst or if it doesn't resolve.     Lucia EstelleFeng Narely Nobles, NP 03/30/16 [phone]

## 2016-04-03 LAB — AEROBIC/ANAEROBIC CULTURE W GRAM STAIN (SURGICAL/DEEP WOUND)

## 2016-04-03 LAB — AEROBIC/ANAEROBIC CULTURE (SURGICAL/DEEP WOUND)

## 2016-04-05 ENCOUNTER — Telehealth: Payer: Self-pay | Admitting: *Deleted

## 2016-04-05 NOTE — Telephone Encounter (Signed)
Attempted to call patient. Cell phone number recording stated patient was unavailable and the listed home number is disconnected.

## 2016-04-23 ENCOUNTER — Encounter: Payer: Self-pay | Admitting: Surgery

## 2016-04-23 ENCOUNTER — Ambulatory Visit (INDEPENDENT_AMBULATORY_CARE_PROVIDER_SITE_OTHER): Payer: Medicaid Other | Admitting: Surgery

## 2016-04-23 ENCOUNTER — Other Ambulatory Visit: Payer: Self-pay

## 2016-04-23 VITALS — BP 123/78 | HR 72 | Temp 97.5°F | Ht 66.0 in | Wt 210.0 lb

## 2016-04-23 DIAGNOSIS — L732 Hidradenitis suppurativa: Secondary | ICD-10-CM

## 2016-04-23 MED ORDER — CIPROFLOXACIN HCL 500 MG PO TABS: 500.0000 mg | ORAL_TABLET | Freq: Two times a day (BID) | ORAL | 0 refills | Status: AC

## 2016-04-23 NOTE — Patient Instructions (Signed)
We have sent your medicine to the pharmacy. Please call our office if you are not improving after taking all of the antibiotic. Please call if you have any questions or concerns.

## 2016-04-23 NOTE — Progress Notes (Signed)
04/23/2016  Reason for Visit:  Right thigh abscess  History of Present Illness: Jasmine Hale is a 30 y.o. female with a history of hydradenitis of the bilateral groin and upper thigh area, status post incision and drainage of one small upper thigh abscess on the right side on 11/21 at urgent care. Cultures grew Escherichia coli and she had been given a prescription for doxycycline at the time. She reports that that area improved. During that same time she also had one small area in the right groin which has since gotten worse and started having very minimal drainage over last 2-3 days. She has not had any fevers, chills, chest pain, shortness of breath, nausea, vomiting, abdominal pain. She does describe some tenderness over the right groin area with minimal erythema and swelling. At the time of her visit at urgent care she also had 1 small cyst in the left groin which has since remained stable with no drainage and no tenderness.   Past Medical History: Past Medical History:  Diagnosis Date  . Herpes genitalis      Past Surgical History: Past Surgical History:  Procedure Laterality Date  . ORTHOPEDIC SURGERY     Hand, Pelvis, and arm had pins placed   . TUBAL LIGATION Bilateral 10/15/2015   Procedure: bilateral postpartum salpingectomy changed to bilateral laparoscopic postpartum salpingectomy;  Surgeon: Elenora Fenderhelsea C Ward, MD;  Location: [facility] OR[facility];  Service: Gynecology;  Laterality: Bilateral;    Home Medications: Prior to Admission medications   Medication Sig Start Date End Date Taking? Authorizing Provider  albuterol (PROVENTIL HFA;VENTOLIN HFA) 108 (90 Base) MCG/ACT inhaler Inhale into the lungs. 08/02/15 08/01/16 Yes Historical Provider, MD  oxyCODONE-acetaminophen (PERCOCET/ROXICET) 5-325 MG tablet Take 1 tablet by mouth every 4 (four) hours as needed for moderate pain or severe pain. 10/16/15  Yes Chelsea C Ward, MD  QVAR 40 MCG/ACT inhaler INL 1 INHALATION ITL BID 04/18/16  Yes  Historical Provider, MD  venlafaxine XR (EFFEXOR-XR) 75 MG 24 hr capsule  01/27/16  Yes Historical Provider, MD  ciprofloxacin (CIPRO) 500 MG tablet Take 1 tablet (500 mg total) by mouth 2 (two) times daily. 04/23/16 04/28/16  Henrene DodgeJose Regla Fitzgibbon, MD    Allergies: Allergies  Allergen Reactions  . Sulfa Antibiotics Hives    Social History:  reports that she has been smoking Cigarettes.  She has been smoking about 1.00 pack per day. She has never used smokeless tobacco. She reports that she does not drink alcohol or use drugs.   Family History: History reviewed. No pertinent family history.  Review of Systems: Review of Systems  Constitutional: Negative for chills and fever.  HENT: Negative for hearing loss.   Eyes: Negative for blurred vision.  Respiratory: Negative for cough and shortness of breath.   Cardiovascular: Negative for chest pain and leg swelling.  Gastrointestinal: Negative for abdominal pain, heartburn, nausea and vomiting.  Genitourinary: Negative for dysuria and hematuria.  Musculoskeletal: Negative for myalgias.  Skin: Negative for rash.  Neurological: Negative for dizziness.  Psychiatric/Behavioral: Negative for depression.  All other systems reviewed and are negative.   Physical Exam BP 123/78   Pulse 72   Temp 97.5 F (36.4 C) (Oral)   Ht 5\' 6"  (1.676 m)   Wt 95.3 kg (210 lb)   LMP 04/17/2016   BMI 33.89 kg/m  CONSTITUTIONAL: No acute distress HEENT:  Normocephalic, atraumatic, extraocular motion intact. NECK: Trachea is midline, and there is no jugular venous distension.  RESPIRATORY:  Lungs are clear, and breath  sounds are equal bilaterally. Normal respiratory effort without pathologic use of accessory muscles. CARDIOVASCULAR: Heart is regular without murmurs, gallops, or rubs. GI: The abdomen is soft, nondistended, nontender. MUSCULOSKELETAL:  Normal muscle strength and tone in all four extremities.  No peripheral edema or cyanosis. SKIN: Patient has  evidence of hydradenitis in bilateral upper thighs and groins. On the right groin patient has a small 1 cm abscess area with very minimal purulent drainage very minimal cellulitis and induration. Upon applying pressure is very minimal drainage obtained. She does have tenderness to palpation over this area. On the left groin there is 1 small cyst measuring about 1 cm is nontender nonerythematous and nonindurated. There is evidence of prior incision and drainage procedures of her the groin and thigh areas. NEUROLOGIC:  Motor and sensation is grossly normal.  Cranial nerves are grossly intact. PSYCH:  Alert and oriented to person, place and time. Affect is normal.  Laboratory Analysis: No results found for this or any previous visit (from the past 24 hour(s)).  Imaging: No results found.  Assessment and Plan: This is a 30 y.o. female who presents with hydradenitis of the bilateral groins and upper thighs with a worsening small right groin abscess.  -At this point the patient is deferring having a new incision and drainage procedure of this new abscess as she reports that the previous one was very painful procedure and she would rather attempt conservative measures first. Given that we will prescribe her a course of ciprofloxacin which was sensitive for the Escherichia coli that grew in her previous cultures for a five-day course. I have also instructed her to do warm compresses to the right groin area to allow for better drainage. She does understand that if her symptoms were not to improve she should contact us again for further evaluation and a possible incision and drainage procedure in the office. She understands this plan and all of her questions have been answered.   Howie IllJose Luis Wlliam Grosso, MD [facility]

## 2016-06-09 ENCOUNTER — Ambulatory Visit
Admission: EM | Admit: 2016-06-09 | Discharge: 2016-06-09 | Disposition: A | Discharge status: Home / Self Care | Payer: Medicaid Other | Attending: Family Medicine | Admitting: Family Medicine

## 2016-06-09 ENCOUNTER — Encounter: Payer: Self-pay | Admitting: Gynecology

## 2016-06-09 DIAGNOSIS — L732 Hidradenitis suppurativa: Secondary | ICD-10-CM

## 2016-06-09 MED ORDER — NAPROXEN 500 MG PO TABS: 500.0000 mg | ORAL_TABLET | Freq: Two times a day (BID) | ORAL | 0 refills | Status: DC

## 2016-06-09 MED ORDER — MUPIROCIN 2 % EX OINT: 1.0000 "application " | TOPICAL_OINTMENT | Freq: Three times a day (TID) | CUTANEOUS | 0 refills | Status: DC

## 2016-06-09 MED ORDER — TIZANIDINE HCL 4 MG PO CAPS: 4.0000 mg | ORAL_CAPSULE | Freq: Three times a day (TID) | ORAL | 0 refills | Status: DC

## 2016-06-09 NOTE — ED Triage Notes (Signed)
Patient c/o recurrent abscess at groin area. Patient also clo back spasm on and off.

## 2016-06-09 NOTE — ED Provider Notes (Signed)
CSN: [id number]     Arrival date & time 06/09/16  1046 History   None    Chief Complaint  Patient presents with  . Abscess  . back spasm   (Consider location/radiation/quality/duration/timing/severity/associated sxs/prior Treatment) HPI  This is a 36 old female who presents with 2 problems.  Problem is a boil that she has over her left groin near the labia. She states she has a history of hidradenitis suppurativa that she is currently under the care of a dermatologist. She takes daily antibiotics it is also having to apply a cream twice a day. Says she has noticed a boil starting it is very hard and tender. Is not any fever or chills.  Second problem is that of low back spasms that she has frequently but intermittently. She has been diagnosed in the past and given prescriptions of muscle relaxers which seemed to help. Denies Radicular component. Has had no incontinence.      Past Medical History:  Diagnosis Date  . Herpes genitalis    Past Surgical History:  Procedure Laterality Date  . ORTHOPEDIC SURGERY     Hand, Pelvis, and arm had pins placed   . TUBAL LIGATION Bilateral 10/15/2015   Procedure: bilateral postpartum salpingectomy changed to bilateral laparoscopic postpartum salpingectomy;  Surgeon: Elenora Fender Ward, MD;  Location: [facility] ORS;  Service: Gynecology;  Laterality: Bilateral;   No family history on file. Social History  Substance Use Topics  . Smoking status: Current Every Day Smoker    Packs/day: 1.00    Types: Cigarettes  . Smokeless tobacco: Never Used  . Alcohol use No   OB History    Gravida Para Term Preterm AB Living   5 4 2 2 1 4    SAB TAB Ectopic Multiple Live Births   1     0 4     Review of Systems  Allergies  Sulfa antibiotics  Home Medications   Prior to Admission medications   Medication Sig Start Date End Date Taking? Authorizing Provider  albuterol (PROVENTIL HFA;VENTOLIN HFA) 108 (90 Base) MCG/ACT inhaler Inhale into the lungs. 08/02/15  08/01/16 Yes Historical Provider, MD  QVAR 40 MCG/ACT inhaler INL 1 INHALATION ITL BID 04/18/16  Yes Historical Provider, MD  mupirocin ointment (BACTROBAN) 2 % Apply 1 application topically 3 (three) times daily. 06/09/16   Lutricia Feil, PA-C  naproxen (NAPROSYN) 500 MG tablet Take 1 tablet (500 mg total) by mouth 2 (two) times daily with a meal. 06/09/16   Lutricia Feil, PA-C  tiZANidine (ZANAFLEX) 4 MG capsule Take 1 capsule (4 mg total) by mouth 3 (three) times daily. 06/09/16   Lutricia Feil, PA-C   Meds Ordered and Administered this Visit  Medications - No data to display  BP 126/78 (BP Location: Left Arm)   Pulse 65   Temp 98.4 F (36.9 C) (Oral)   Resp 16   Ht 5\' 6"  (1.676 m)   Wt 204 lb (92.5 kg)   LMP 06/08/2016   SpO2 100%   BMI 32.93 kg/m  No data found.   Physical Exam  Constitutional: She is oriented to person, place, and time. She appears well-developed and well-nourished. No distress.  HENT:  Head: Normocephalic and atraumatic.  Eyes: EOM are normal. Pupils are equal, round, and reactive to light. Right eye exhibits no discharge. Left eye exhibits no discharge.  Neck: Normal range of motion. Neck supple.  Musculoskeletal: She exhibits tenderness.  Examination of lumbar spine was performed with Efraim Kaufmann, CMA  as Camera operatorassistant and chaperone. Is a level pelvis in stance. Pain is indicated by the patient as approximately L4-5 level. There is no radiation. Flexion  shows the patient to be able to forward flex to the hands the level of the mid shin. Flexion is fluid with reversal lordotic curve with the complaints of discomfort to the left. Thoracic rotation is also limited to left rotation with complaints of pain worsened.  Lymphadenopathy:    She has no cervical adenopathy.  Neurological: She is alert and oriented to person, place, and time.  Skin: Skin is warm and dry. She is not diaphoretic. There is erythema.  Examination of the left groin shows a small punctate area  with surrounding deep induration approximately 1 cm in diameter. There is no drainage although the punctate area does show open the surface. No purulence can be expressed. No fluctuance present. It is noticed that the patient does shave this area. This is directly in the fold between the thigh and the labia.  Nursing note and vitals reviewed.   Urgent Care Course     Procedures (including critical care time)  Labs Review Labs Reviewed - No data to display  Imaging Review No results found.   Visual Acuity Review  Right Eye Distance:   Left Eye Distance:   Bilateral Distance:    Right Eye Near:   Left Eye Near:    Bilateral Near:         MDM   1. Hidradenitis suppurativa    . Discharge Medication List as of 06/09/2016 12:25 PM    START taking these medications   Details  mupirocin ointment (BACTROBAN) 2 % Apply 1 application topically 3 (three) times daily., Starting Sat 06/09/2016, Normal    naproxen (NAPROSYN) 500 MG tablet Take 1 tablet (500 mg total) by mouth 2 (two) times daily with a meal., Starting Sat 06/09/2016, Normal    tiZANidine (ZANAFLEX) 4 MG capsule Take 1 capsule (4 mg total) by mouth 3 (three) times daily., Starting Sat 06/09/2016, Normal      Plan: 1. Test/x-ray results and diagnosis reviewed with patient 2. rx as per orders; risks, benefits, potential side effects reviewed with patient 3. Recommend supportive treatment with Compresses 3 times daily and application of Bactroban following thoroughly drying the area. Is was outlined and described to the patient in detail. Follow-up with her dermatologist next week if she is not improving. Her lumbar spasm I will place her on Naprosyn twice daily with food. She will not take any other NSAIDs. Also place her on Zanaflex for muscle relaxation. She was cautioned regarding activities requiring concentration and judgment and not to drive while taking it. She Is not breast feeding at the present time 4. F/u prn if  symptoms worsen or don't improve     Lutricia FeilWilliam P Vieno Tarrant, PA-C 06/09/16 1232

## 2016-10-15 ENCOUNTER — Ambulatory Visit
Admission: RE | Admit: 2016-10-15 | Discharge: 2016-10-15 | Disposition: A | Discharge status: Home / Self Care | Payer: Disability Insurance | Source: Ambulatory Visit | Attending: Pediatrics | Admitting: Pediatrics

## 2016-10-15 ENCOUNTER — Other Ambulatory Visit: Payer: Self-pay | Admitting: Pediatrics

## 2016-10-15 DIAGNOSIS — M2560 Stiffness of unspecified joint, not elsewhere classified: Secondary | ICD-10-CM

## 2016-10-15 DIAGNOSIS — M25552 Pain in left hip: Secondary | ICD-10-CM | POA: Insufficient documentation

## 2016-10-15 DIAGNOSIS — X58XXXA Exposure to other specified factors, initial encounter: Secondary | ICD-10-CM | POA: Diagnosis not present

## 2016-10-15 DIAGNOSIS — Z87828 Personal history of other (healed) physical injury and trauma: Secondary | ICD-10-CM

## 2016-10-15 DIAGNOSIS — S32592A Other specified fracture of left pubis, initial encounter for closed fracture: Secondary | ICD-10-CM | POA: Insufficient documentation
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
## 2018-06-10 ENCOUNTER — Ambulatory Visit
Admission: EM | Admit: 2018-06-10 | Discharge: 2018-06-10 | Disposition: A | Discharge status: Home / Self Care | Payer: Medicaid Other | Attending: Family Medicine | Admitting: Family Medicine

## 2018-06-10 ENCOUNTER — Encounter: Payer: Self-pay | Admitting: Emergency Medicine

## 2018-06-10 ENCOUNTER — Other Ambulatory Visit: Payer: Self-pay

## 2018-06-10 DIAGNOSIS — N920 Excessive and frequent menstruation with regular cycle: Secondary | ICD-10-CM | POA: Diagnosis not present

## 2018-06-10 DIAGNOSIS — N946 Dysmenorrhea, unspecified: Secondary | ICD-10-CM | POA: Diagnosis not present

## 2018-06-10 LAB — CBC WITH DIFFERENTIAL/PLATELET
Abs Immature Granulocytes: 0.03 10*3/uL (ref 0.00–0.07)
BASOS ABS: 0.1 10*3/uL (ref 0.0–0.1)
Basophils Relative: 1 %
EOS ABS: 0.2 10*3/uL (ref 0.0–0.5)
EOS PCT: 2 %
HEMATOCRIT: 42.4 % (ref 36.0–46.0)
HEMOGLOBIN: 14.5 g/dL (ref 12.0–15.0)
IMMATURE GRANULOCYTES: 0 %
Lymphocytes Relative: 35 %
Lymphs Abs: 3.7 10*3/uL (ref 0.7–4.0)
MCH: 31.5 pg (ref 26.0–34.0)
MCHC: 34.2 g/dL (ref 30.0–36.0)
MCV: 92 fL (ref 80.0–100.0)
MONOS PCT: 5 %
Monocytes Absolute: 0.5 10*3/uL (ref 0.1–1.0)
NEUTROS PCT: 57 %
Neutro Abs: 6 10*3/uL (ref 1.7–7.7)
Platelets: 209 10*3/uL (ref 150–400)
RBC: 4.61 MIL/uL (ref 3.87–5.11)
RDW: 12.9 % (ref 11.5–15.5)
WBC: 10.5 10*3/uL (ref 4.0–10.5)
nRBC: 0 % (ref 0.0–0.2)

## 2018-06-10 MED ORDER — MEFENAMIC ACID 250 MG PO CAPS: 500.0000 mg | ORAL_CAPSULE | Freq: Three times a day (TID) | ORAL | 1 refills | Status: DC | PRN

## 2018-06-10 MED ORDER — NAPROXEN 500 MG PO TABS: 500.0000 mg | ORAL_TABLET | Freq: Two times a day (BID) | ORAL | 1 refills | Status: DC

## 2018-06-10 NOTE — ED Triage Notes (Signed)
Patient states that she started her menstrual period on Sunday.  Patient states that her abdominal cramping is more painful during this cycle and she states that she is passing more clots.  Patient states her past four menstrual cycles she has passed blood clots.

## 2018-06-10 NOTE — Discharge Instructions (Signed)
Medication as prescribed.  If continues to be a problem, please see your primary care physician or OB/GYN.  Consider IUD.  Take care  Dr. Adriana Simas
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
## 2018-10-02 ENCOUNTER — Ambulatory Visit
Admission: EM | Admit: 2018-10-02 | Discharge: 2018-10-02 | Disposition: A | Discharge status: Home / Self Care | Payer: Medicaid Other

## 2018-10-02 ENCOUNTER — Other Ambulatory Visit: Payer: Self-pay

## 2018-10-02 DIAGNOSIS — M5442 Lumbago with sciatica, left side: Secondary | ICD-10-CM

## 2018-10-02 MED ORDER — METHYLPREDNISOLONE 4 MG PO TBPK: ORAL_TABLET | ORAL | 0 refills | Status: DC

## 2018-10-02 MED ORDER — IBUPROFEN 800 MG PO TABS: 800.0000 mg | ORAL_TABLET | Freq: Three times a day (TID) | ORAL | 0 refills | Status: DC

## 2018-10-02 MED ORDER — TRAMADOL HCL 50 MG PO TABS: 50.0000 mg | ORAL_TABLET | Freq: Four times a day (QID) | ORAL | 0 refills | Status: DC | PRN

## 2018-10-02 NOTE — ED Triage Notes (Signed)
Patient complains of lower back pain that started 4 days ago. 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 Patient states that she has been taking advil and flexeril without much relief.
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
## 2018-10-02 NOTE — Discharge Instructions (Signed)
It was very nice meeting you today in clinic. Thank you for entrusting me with your care.  ° °As discussed, your pain seems to be musculoskeletal in nature. Plans for treating you are as follows: ° °Please utilize the medications that we discussed. Your prescriptions have been called in to your pharmacy.  °Avoid overdoing it, but you need to make efforts to remain active as tolerated.  °Avoiding activity all together can make your pain worse. °You may find that alternating between ice and moist heat application will help with your pain.  °Heat/ice should be applied for 10-15 minutes at a time at least 3-4 times a day. ° °Make arrangements to follow up with your regular doctor in 1 week for re-evaluation. If your symptoms/condition worsens, please seek follow up care either here or in the ER. Please remember, our [facility] providers are "right here with you" when you need us.  ° °Again, it was my pleasure to take care of you today. Thank you for choosing our clinic. I hope that you start to feel better quickly.  ° °Kleber Crean, MSN, APRN, FNP-C, CEN °Advanced Practice Provider °[facility] ° °

## 2018-10-08 ENCOUNTER — Telehealth: Payer: Medicaid Other | Admitting: Nurse Practitioner

## 2018-10-08 DIAGNOSIS — M5442 Lumbago with sciatica, left side: Secondary | ICD-10-CM

## 2018-10-08 NOTE — Progress Notes (Signed)
Based on what you shared with me it looks like you have back pain,that should be evaluated in a face to face office visit.  According to your chart you were seen in ED om 10/02/18.and was given steroids, advil and ultram. You should still be taking prednisone and advil. I can not give you any pain meds in an evisit. You need to give time for medication to work.. if you are no better then you will need to your PCP. This is not something urgent that I think you need to see someone today.   NOTE: If you entered your credit card information for this eVisit, you will not be charged. You may see a "hold" on your card for the $30 but that hold will drop off and you will not have a charge processed.  If you are having a true medical emergency please call 911.  If you need an urgent face to face visit, [facility] has four urgent care centers for your convenience.  If you need care fast and have a high deductible or no insurance consider:   [email or web address] to reserve your spot online an avoid wait times  [facility] [address] [place], [place] [address] 8 am to 8 pm Monday-Friday 10 am to 4 pm Saturday-Sunday *Across the street from [employer]  [address] [place], [address] 8 am to 5 pm Monday-Friday * In the [facility] on the [facility]   The following sites will take your  insurance:  . [facility]  [phone] Get Driving Directions Find a Provider at this Location  [address] [place], [place] [address] . 10 am to 8 pm Monday-Friday . 12 pm to 8 pm Saturday-Sunday   . [facility] at [facility]  [phone] Get Driving Directions Find a Provider at this Location  [address] [place], [place] [address] . 8 am to 8 pm Monday-Friday . 9 am to 6 pm Saturday . 11 am to 6 pm Sunday   . [facility] at [facility]   [phone] Get Driving Directions  [address] [place], [place] [address] . 8 am to 8 pm Monday-Friday . 8 am to 4 pm Saturday-Sunday   Your e-visit answers were reviewed by a board certified advanced clinical practitioner to complete your personal care plan.

## 2018-10-22 ENCOUNTER — Other Ambulatory Visit: Payer: Self-pay

## 2018-10-22 ENCOUNTER — Ambulatory Visit
Admission: EM | Admit: 2018-10-22 | Discharge: 2018-10-22 | Disposition: A | Discharge status: Home / Self Care | Payer: Medicaid Other | Attending: Emergency Medicine | Admitting: Emergency Medicine

## 2018-10-22 ENCOUNTER — Ambulatory Visit: Payer: Medicaid Other

## 2018-10-22 ENCOUNTER — Encounter: Payer: Self-pay | Admitting: Emergency Medicine

## 2018-10-22 DIAGNOSIS — M5442 Lumbago with sciatica, left side: Secondary | ICD-10-CM | POA: Diagnosis not present

## 2018-10-22 DIAGNOSIS — G8929 Other chronic pain: Secondary | ICD-10-CM | POA: Insufficient documentation

## 2018-10-22 MED ORDER — MELOXICAM 7.5 MG PO TABS: 7.5000 mg | ORAL_TABLET | Freq: Every day | ORAL | 1 refills | Status: DC | PRN

## 2018-10-22 NOTE — ED Triage Notes (Signed)
Patient was seen here on 5/28 for back pain.  Patient states that her pain has not gotten any better.  Patient c/o ongoing pain in her back and pain that goes down both legs.  Patient states that she finished the steroid.  Patient reports no relief with Tramadol or Flexeril. Patient denies any urinary problems.  Patient denies fevers.
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
## 2019-02-16 ENCOUNTER — Ambulatory Visit
Admission: EM | Admit: 2019-02-16 | Discharge: 2019-02-16 | Disposition: A | Discharge status: Home / Self Care | Payer: Medicaid Other | Attending: Family Medicine | Admitting: Family Medicine

## 2019-02-16 DIAGNOSIS — L0292 Furuncle, unspecified: Secondary | ICD-10-CM | POA: Diagnosis not present

## 2019-02-16 MED ORDER — DOXYCYCLINE HYCLATE 100 MG PO TABS: 100.0000 mg | ORAL_TABLET | Freq: Two times a day (BID) | ORAL | 0 refills | Status: DC

## 2019-02-16 NOTE — ED Triage Notes (Signed)
Pt presents with complaints of boils on her lower abdomen right above her pants on both sides. States they have been there x 1 week. Reports hx of same. Areas are painful.
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
## 2019-02-16 NOTE — Discharge Instructions (Signed)
Warm compresses to area °

## 2019-05-08 ENCOUNTER — Ambulatory Visit
Admission: EM | Admit: 2019-05-08 | Discharge: 2019-05-08 | Disposition: A | Discharge status: Home / Self Care | Payer: Medicaid Other | Attending: Family Medicine | Admitting: Family Medicine

## 2019-05-08 ENCOUNTER — Other Ambulatory Visit: Payer: Self-pay

## 2019-05-08 ENCOUNTER — Encounter: Payer: Self-pay | Admitting: Emergency Medicine

## 2019-05-08 DIAGNOSIS — Z79899 Other long term (current) drug therapy: Secondary | ICD-10-CM | POA: Insufficient documentation

## 2019-05-08 DIAGNOSIS — R05 Cough: Secondary | ICD-10-CM | POA: Insufficient documentation

## 2019-05-08 DIAGNOSIS — F17210 Nicotine dependence, cigarettes, uncomplicated: Secondary | ICD-10-CM | POA: Diagnosis not present

## 2019-05-08 DIAGNOSIS — J029 Acute pharyngitis, unspecified: Secondary | ICD-10-CM | POA: Diagnosis not present

## 2019-05-08 DIAGNOSIS — Z882 Allergy status to sulfonamides status: Secondary | ICD-10-CM | POA: Insufficient documentation

## 2019-05-08 DIAGNOSIS — Z20822 Contact with and (suspected) exposure to covid-19: Secondary | ICD-10-CM | POA: Insufficient documentation

## 2019-05-08 DIAGNOSIS — R053 Chronic cough: Secondary | ICD-10-CM

## 2019-05-08 LAB — GROUP A STREP BY PCR: Group A Strep by PCR: NOT DETECTED

## 2019-05-08 MED ORDER — PREDNISONE 50 MG PO TABS: ORAL_TABLET | ORAL | 0 refills | Status: DC

## 2019-05-08 NOTE — ED Triage Notes (Signed)
Patient c/o cough for a month.  Patient c/o sore throat for the past 3 days.  Patient denies fevers.
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
## 2019-05-08 NOTE — Discharge Instructions (Signed)
Steroids as prescribed.  Take care  Dr. Adriana Simas

## 2019-05-10 LAB — NOVEL CORONAVIRUS, NAA (HOSP ORDER, SEND-OUT TO REF LAB; TAT 18-24 HRS): SARS-CoV-2, NAA: NOT DETECTED

## 2019-12-22 IMAGING — CR LUMBAR SPINE - COMPLETE 4+ VIEW
5 series · 5 of 5 positions shown · non-contrast
Comparison: AP pelvis 10/15/2016.

CLINICAL DATA: Pain lower lumbar region.

EXAM:
LUMBAR SPINE - COMPLETE 4+ VIEW

[l-spine ap]
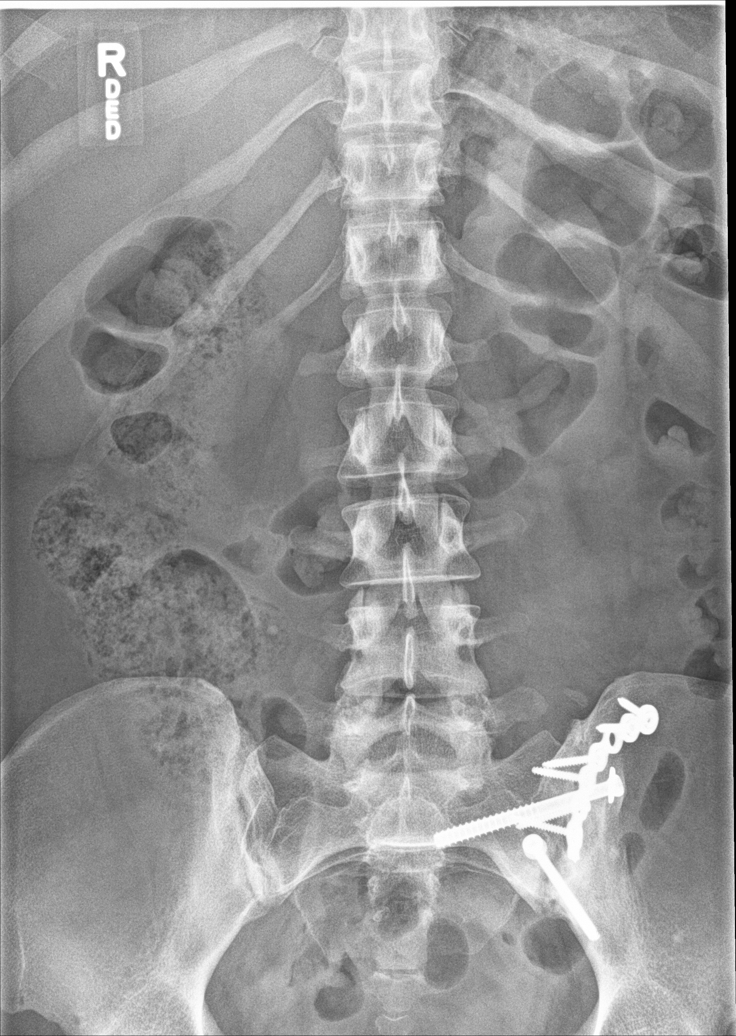

[l-spine obl (1 of 2)]
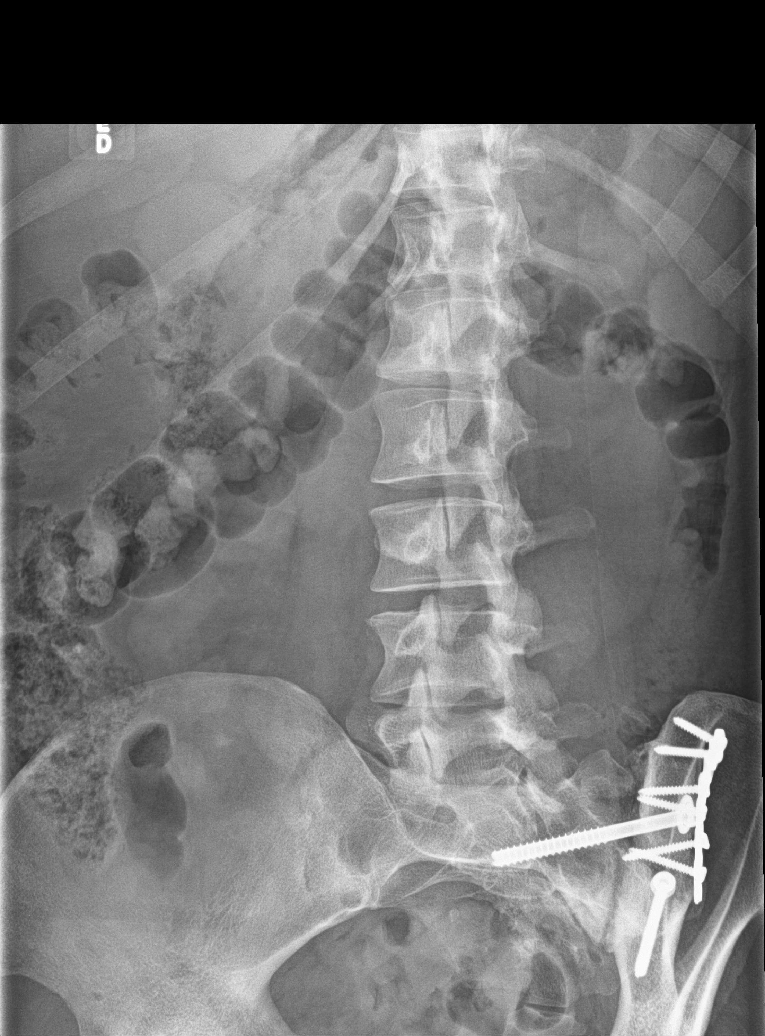

[l-spine obl (2 of 2)]
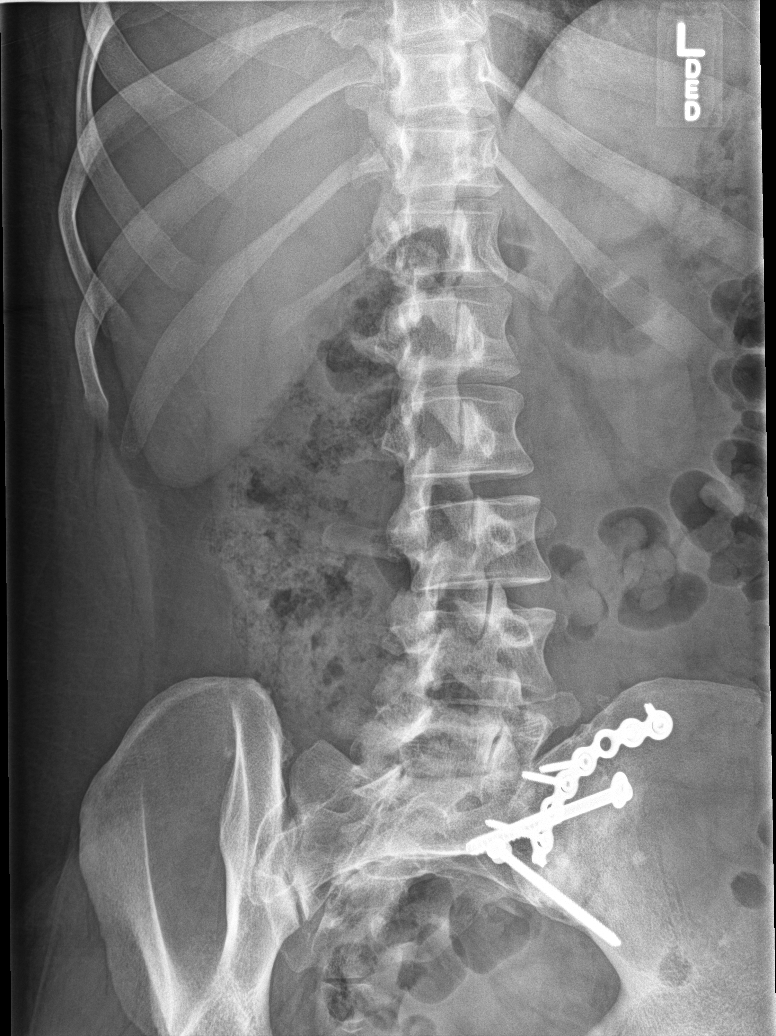

[l-spine lat]
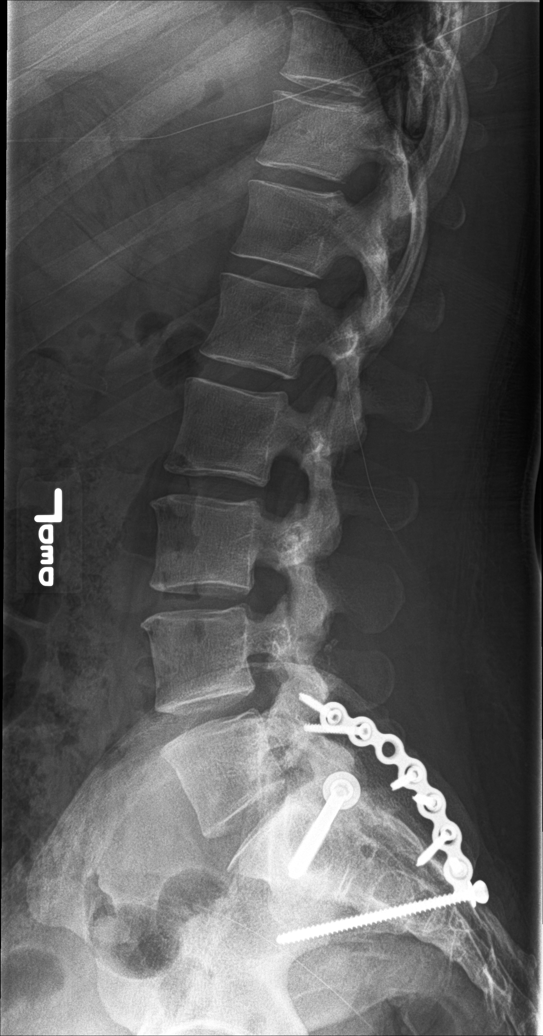

[l-spine spot]
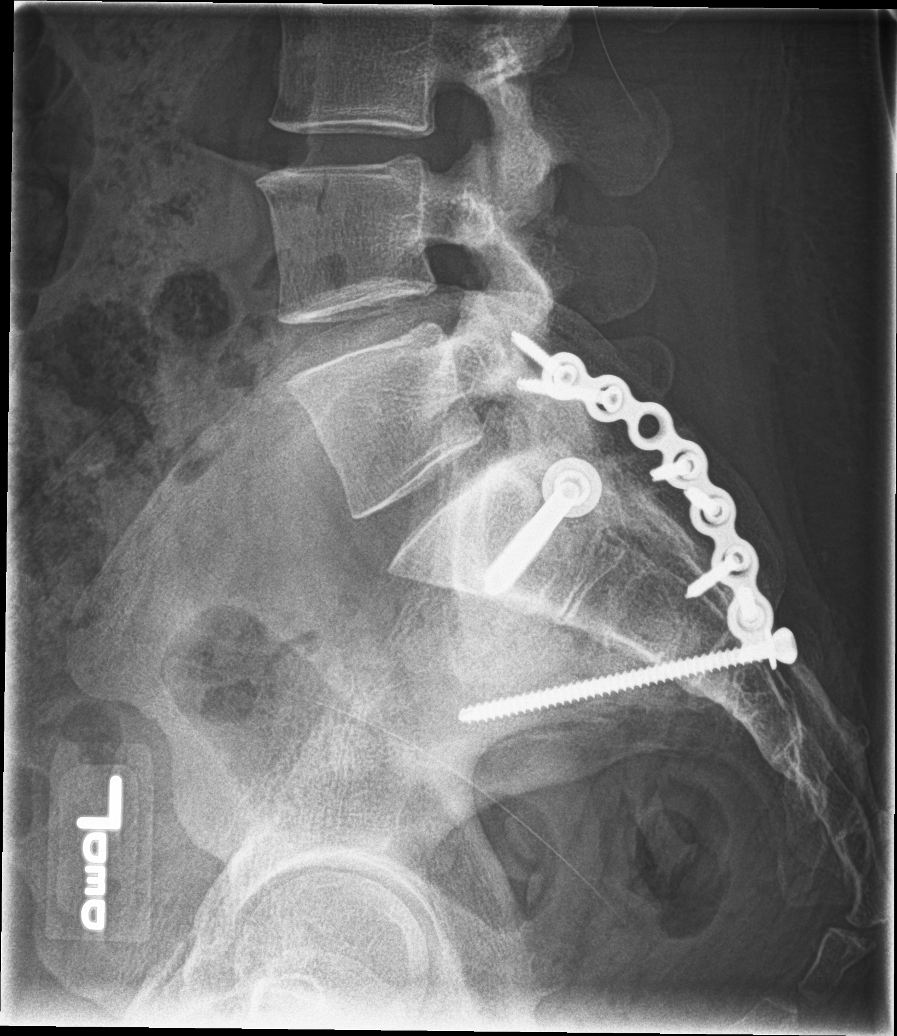

[5 of 5 positions shown; findings below may reference images not displayed]

FINDINGS: Prior iliosacral fusion. Hardware intact and in stable position.
Mild diffuse degenerative change lumbar spine. No acute bony
abnormality. Calcific densities noted over the abdomen and pelvis
are most likely phleboliths. Ureteral stones cannot be completely
excluded
IMPRESSION: 1. Prior iliosacral fusion. Hardware intact and in stable position.

2. Mild diffuse degenerative change lumbar spine. No acute bony
abnormality.

## 2021-03-20 ENCOUNTER — Other Ambulatory Visit: Payer: Self-pay

## 2021-03-20 ENCOUNTER — Ambulatory Visit
Admission: RE | Admit: 2021-03-20 | Discharge: 2021-03-20 | Disposition: A | Discharge status: Home / Self Care | Payer: Medicaid Other | Source: Ambulatory Visit | Attending: Emergency Medicine | Admitting: Emergency Medicine

## 2021-03-20 VITALS — BP 116/86 | HR 83 | Temp 98.4°F | Resp 18

## 2021-03-20 DIAGNOSIS — J069 Acute upper respiratory infection, unspecified: Secondary | ICD-10-CM | POA: Diagnosis not present

## 2021-03-20 DIAGNOSIS — J40 Bronchitis, not specified as acute or chronic: Secondary | ICD-10-CM | POA: Diagnosis not present

## 2021-03-20 MED ORDER — ALBUTEROL SULFATE HFA 108 (90 BASE) MCG/ACT IN AERS
1.0000 | INHALATION_SPRAY | RESPIRATORY_TRACT | 1 refills | Status: DC | PRN
Start: 2021-03-20 — End: 2021-10-14

## 2021-03-20 MED ORDER — DOXYCYCLINE HYCLATE 100 MG PO CAPS: 100.0000 mg | ORAL_CAPSULE | Freq: Two times a day (BID) | ORAL | 0 refills | Status: DC

## 2021-03-20 MED ORDER — BENZONATATE 100 MG PO CAPS: 200.0000 mg | ORAL_CAPSULE | Freq: Three times a day (TID) | ORAL | 0 refills | Status: DC

## 2021-03-20 MED ORDER — IPRATROPIUM BROMIDE 0.06 % NA SOLN: 2.0000 | Freq: Four times a day (QID) | NASAL | 12 refills | Status: DC

## 2021-03-20 MED ORDER — PROMETHAZINE-DM 6.25-15 MG/5ML PO SYRP: 5.0000 mL | ORAL_SOLUTION | Freq: Four times a day (QID) | ORAL | 0 refills | Status: DC | PRN

## 2021-03-20 NOTE — Discharge Instructions (Addendum)
Take the Doxycycline twice daily with food for treatment of your URI and bronchitis.   Use the Atrovent nasal spray, 2 squirts in each nostril every 6 hours, as needed for runny nose and postnasal drip.  Use the Tessalon Perles every 8 hours during the day.  Take them with a small sip of water.  They may give you some numbness to the base of your tongue or a metallic taste in your mouth, this is normal.  Use the Promethazine DM cough syrup at bedtime for cough and congestion.  It will make you drowsy so do not take it during the day.  Use the Albuterol inhaler, 1-2 puffs every 4-6 hours as needed for shortness of breath and wheezing.   Return for reevaluation or see your primary care provider for any new or worsening symptoms.

## 2021-03-20 NOTE — ED Triage Notes (Signed)
Pt presents today with c/o of cough x 3 days, denies fever.
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
## 2021-10-14 ENCOUNTER — Ambulatory Visit
Admission: EM | Admit: 2021-10-14 | Discharge: 2021-10-14 | Disposition: A | Discharge status: Home / Self Care | Payer: Medicaid Other

## 2021-10-14 ENCOUNTER — Encounter: Payer: Self-pay | Admitting: Emergency Medicine

## 2021-10-14 DIAGNOSIS — J209 Acute bronchitis, unspecified: Secondary | ICD-10-CM | POA: Diagnosis not present

## 2021-10-14 DIAGNOSIS — H6501 Acute serous otitis media, right ear: Secondary | ICD-10-CM | POA: Diagnosis not present

## 2021-10-14 MED ORDER — DOXYCYCLINE HYCLATE 100 MG PO CAPS: 100.0000 mg | ORAL_CAPSULE | Freq: Two times a day (BID) | ORAL | 0 refills | Status: DC

## 2021-10-14 MED ORDER — FLUTICASONE PROPIONATE 50 MCG/ACT NA SUSP: 2.0000 | Freq: Every day | NASAL | 0 refills | Status: DC

## 2021-10-14 MED ORDER — ALBUTEROL SULFATE HFA 108 (90 BASE) MCG/ACT IN AERS: 1.0000 | INHALATION_SPRAY | RESPIRATORY_TRACT | 0 refills | Status: DC | PRN

## 2021-10-14 MED ORDER — PSEUDOEPHEDRINE HCL ER 120 MG PO TB12: 120.0000 mg | ORAL_TABLET | Freq: Two times a day (BID) | ORAL | 0 refills | Status: DC
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
## 2021-10-14 NOTE — ED Triage Notes (Signed)
Patient c/o cough, sinus congestion, and runny nose that started 2 weeks ago.  Patient reports pain in her right ear that started last night. Patient denies fevers.

## 2021-12-08 ENCOUNTER — Ambulatory Visit
Admission: EM | Admit: 2021-12-08 | Discharge: 2021-12-08 | Disposition: A | Discharge status: Home / Self Care | Payer: Medicaid Other | Attending: Physician Assistant | Admitting: Physician Assistant

## 2021-12-08 DIAGNOSIS — K047 Periapical abscess without sinus: Secondary | ICD-10-CM

## 2021-12-08 DIAGNOSIS — K029 Dental caries, unspecified: Secondary | ICD-10-CM

## 2021-12-08 MED ORDER — AMOXICILLIN-POT CLAVULANATE 875-125 MG PO TABS
1.0000 | ORAL_TABLET | Freq: Two times a day (BID) | ORAL | 0 refills | Status: AC
Start: 2021-12-08 — End: 2021-12-18

## 2021-12-08 MED ORDER — HYDROCODONE-ACETAMINOPHEN 5-325 MG PO TABS: 1.0000 | ORAL_TABLET | Freq: Four times a day (QID) | ORAL | 0 refills | Status: DC | PRN

## 2021-12-08 NOTE — Discharge Instructions (Addendum)
-  Given the redness of the gums and the chronic cavity, we will start you on Augmentin for possible infection.  1 tablet twice a day for 7 days. -Can continue the ibuprofen and Tylenol as well as topical medications for pain. -If pain not controlled by oral topical therapies or ibuprofen/Tylenol, can use the Norco every 6 hours as needed.  Some locations of [facility] do take Medicaid.  [phone] to see which places take it. -[facility] in [place] takes medicaid patients. [phone]
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
## 2021-12-08 NOTE — ED Triage Notes (Signed)
Pt c/o dental pain.   Pt states that it has been hurting for the last 30-40 min. Pt states that it hurts when breathing and talking.

## 2021-12-12 ENCOUNTER — Ambulatory Visit
Admission: EM | Admit: 2021-12-12 | Discharge: 2021-12-12 | Disposition: A | Discharge status: Home / Self Care | Payer: Medicaid Other | Attending: Internal Medicine | Admitting: Internal Medicine

## 2021-12-12 DIAGNOSIS — K0889 Other specified disorders of teeth and supporting structures: Secondary | ICD-10-CM

## 2021-12-12 MED ORDER — HYDROCODONE-ACETAMINOPHEN 5-325 MG PO TABS: 1.0000 | ORAL_TABLET | Freq: Four times a day (QID) | ORAL | 0 refills | Status: DC | PRN
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
## 2021-12-12 NOTE — ED Triage Notes (Signed)
Patient reports that she was here Friday dental pain.   Patient reports that she got 3 teeth pulled yesterday.   Patient reports pain from where they pulled.

## 2021-12-12 NOTE — Discharge Instructions (Signed)
Anticipate gradual improvement in dental pain after extractions yesterday, over the next 2-3 days.  Continue with ibuprofen otc as needed; prescription for additional hydrocodone/acetaminophen #15 tablets sent to the pharmacy.  Afrin and/or a nasal steroid may provide additional relief of discomfort, roots of the extracted teeth were probably in the maxillary sinus and congestion may contribute to discomfort.  Finish amoxicillin/clavulanate previously prescribed.  Ice for 5-10 minutes several times daily to the mouth may also help decrease pain.  Followup as needed with your dental care provider.

## 2021-12-12 NOTE — ED Notes (Signed)
Dr. Dayton Scrape notified of elevated BP.

## 2022-02-26 ENCOUNTER — Ambulatory Visit
Admission: EM | Admit: 2022-02-26 | Discharge: 2022-02-26 | Disposition: A | Discharge status: Home / Self Care | Payer: Medicaid Other | Attending: Urgent Care | Admitting: Urgent Care

## 2022-02-26 DIAGNOSIS — K0500 Acute gingivitis, plaque induced: Secondary | ICD-10-CM

## 2022-02-26 DIAGNOSIS — K029 Dental caries, unspecified: Secondary | ICD-10-CM

## 2022-02-26 MED ORDER — AMOXICILLIN-POT CLAVULANATE 875-125 MG PO TABS: 1.0000 | ORAL_TABLET | Freq: Two times a day (BID) | ORAL | 0 refills | Status: DC

## 2022-02-26 MED ORDER — DICLOFENAC SODIUM 75 MG PO TBEC: 75.0000 mg | DELAYED_RELEASE_TABLET | Freq: Two times a day (BID) | ORAL | 0 refills | Status: AC

## 2022-02-26 MED ORDER — CHLORHEXIDINE GLUCONATE 0.12 % MT SOLN: 15.0000 mL | Freq: Two times a day (BID) | OROMUCOSAL | 0 refills | Status: AC

## 2022-02-26 MED ORDER — TRIAMCINOLONE ACETONIDE 0.1 % MT PSTE: 1.0000 | PASTE | Freq: Two times a day (BID) | OROMUCOSAL | 0 refills | Status: AC

## 2022-02-26 MED ORDER — PREVIDENT 5000 SENSITIVE 1.1-5 % DT GEL: DENTAL | 0 refills | Status: AC
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
## 2022-02-26 NOTE — ED Triage Notes (Signed)
Pt c/o dental pain x3days  Pt was eating chips and salsa and states that a chip piece poked into her gum and that started the pain.   Pt believes that she has a piece of chip stuck in her gum line on the upper right side.

## 2022-02-26 NOTE — Discharge Instructions (Addendum)
You have an abrasion of your gums where the chip hit.  Please apply topical triamcinolone dental paste to the area twice daily until resolved. You do not have an active infection, but given the appearance of your dental health, you are at high risk of this. Start taking the Augmentin twice daily with food only if you start developing sinus pain, swelling of the face, or swollen lymph nodes. Use the chlorhexidine mouthwash twice daily to help prevent further gingivitis.  This is $7 at [facility]. I have called in an anti-inflammatory medication for you to help with the discomfort.  It is called diclofenac, you can take it twice daily with food.  This should be roughly $4 at [facility]. Use the PreviDent daily to help strengthen your enamel and prevent further tooth decay. It is absolutely necessary that you follow-up with a dentist as soon as possible.

## 2022-03-05 HISTORY — PX: DENTAL SURGERY: SHX609

## 2022-04-14 ENCOUNTER — Ambulatory Visit
Admission: EM | Admit: 2022-04-14 | Discharge: 2022-04-14 | Disposition: A | Discharge status: Home / Self Care | Payer: Self-pay | Attending: Physician Assistant | Admitting: Physician Assistant

## 2022-04-14 DIAGNOSIS — K029 Dental caries, unspecified: Secondary | ICD-10-CM

## 2022-04-14 MED ORDER — AMOXICILLIN 500 MG PO CAPS: 1000.0000 mg | ORAL_CAPSULE | Freq: Two times a day (BID) | ORAL | 0 refills | Status: AC

## 2022-04-14 MED ORDER — NAPROXEN 500 MG PO TABS: 500.0000 mg | ORAL_TABLET | Freq: Two times a day (BID) | ORAL | 0 refills | Status: AC | PRN

## 2022-04-14 NOTE — Discharge Instructions (Addendum)
-  I prescribed antibiotics as of improved pain and inflammation. - Ultimately will need to follow-up with your dentist to have these teeth evaluated. - Warm salt water gargles, Orajel, Tylenol, rest and fluids. - Go to ER if your pain significantly worsens or you develop fever.
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
## 2022-04-14 NOTE — ED Triage Notes (Signed)
Pt c/o top right dental pain x2days.  Pt states that she recently had a dental procedure and was told that 2 more teeth needed to be extracted and she began to feel pain from them last night.  Pt has taken ibuprofen and tylenol this morning at 2am.

## 2022-08-24 ENCOUNTER — Ambulatory Visit
Admission: EM | Admit: 2022-08-24 | Discharge: 2022-08-24 | Disposition: A | Discharge status: Home / Self Care | Payer: Self-pay | Attending: Family Medicine | Admitting: Family Medicine

## 2022-08-24 DIAGNOSIS — K047 Periapical abscess without sinus: Secondary | ICD-10-CM

## 2022-08-24 MED ORDER — AMOXICILLIN-POT CLAVULANATE 875-125 MG PO TABS: 1.0000 | ORAL_TABLET | Freq: Two times a day (BID) | ORAL | 0 refills | Status: AC

## 2022-08-24 MED ORDER — TRAMADOL HCL 50 MG PO TABS: 50.0000 mg | ORAL_TABLET | Freq: Four times a day (QID) | ORAL | 0 refills | Status: AC | PRN

## 2022-08-24 NOTE — ED Triage Notes (Signed)
Pt c/o oral swelling on left side and dental pain x1day  Pt has a dentist appointment on Wednesday  Pt states that she has taken  (six ) ibuprofen this morning and they did not help.
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
## 2022-08-24 NOTE — Discharge Instructions (Addendum)
Do not take more than 4-200 mg ibuprofen at one time and no more than 3 times a day. At this time there is no abscess to be drained. You were prescribed an antibiotic. Please take this exactly as directed and do not stop taking it until the entire course of medicine is finished, even if you begin to feel better before finishing the course. We have also given you pain medication; please take this only as prescribed for severe pain, and do not drink or drive while taking this medication. Follow up with your primary care provider as needed.  Schedule an appointment with your dentist or call local dentists to see if they take your insurance or can do a payment payment plan.  Teacher, music school of dentistry are options. Consider [facility] prior to your dental appointment.   Consider the [facility] dental school  free clinic operates from 6-9 pm on select Wednesdays: Arrive around 5:15 PM.   [facility] Dentistry [employer], [employer] [address] [place], [place] [address] Parking Location: [facility]

## 2022-12-05 ENCOUNTER — Ambulatory Visit
Admission: EM | Admit: 2022-12-05 | Discharge: 2022-12-05 | Disposition: A | Discharge status: Home / Self Care | Payer: Self-pay | Attending: Emergency Medicine | Admitting: Emergency Medicine

## 2022-12-05 DIAGNOSIS — J309 Allergic rhinitis, unspecified: Secondary | ICD-10-CM

## 2022-12-05 MED ORDER — FEXOFENADINE HCL 60 MG PO TABS: 60.0000 mg | ORAL_TABLET | Freq: Two times a day (BID) | ORAL | 0 refills | Status: AC

## 2022-12-05 MED ORDER — IPRATROPIUM BROMIDE 0.06 % NA SOLN: 2.0000 | Freq: Four times a day (QID) | NASAL | 12 refills | Status: AC

## 2022-12-05 MED ORDER — FLUTICASONE PROPIONATE 50 MCG/ACT NA SUSP: 2.0000 | Freq: Every day | NASAL | 1 refills | Status: AC
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
## 2022-12-05 NOTE — ED Triage Notes (Signed)
Patient presents to UC for facial pressure under eyes and stuffy nose x 2 days. States she has taken allergy medication with no relief.

## 2022-12-05 NOTE — Discharge Instructions (Addendum)
Take  Allegra 60 mg twice daily for control of your allergy symptoms.  Perform sinus irrigation with a [employer] sinus rinse kit and distilled water 2-3 times a day to wash away pollen particles and mold spores which could be attributing to nasal congestion and mucus production.  Use the Atrovent nasal spray, 2 squirts up each nostril 4 times a day, as needed for nasal congestion and postnasal drip. Use the Flonase at night, 2 squirts in each nostril at bedtime to control nasal allergy symptoms.   Return for reevaluation if your symptoms last longer than 10 days or you start running fever.

## 2022-12-08 ENCOUNTER — Inpatient Hospital Stay: Admit: 2022-12-08 | Payer: Self-pay
